# Patient Record
Sex: Male | Born: 1995 | Race: Black or African American | Hispanic: No | Marital: Single | Smoking: Never smoker
Health system: Southern US, Community
[De-identification: ages and names within clinical notes are randomized; demographics above are authoritative.]

## PROBLEM LIST (undated history)

## (undated) DIAGNOSIS — R768 Other specified abnormal immunological findings in serum: Secondary | ICD-10-CM

## (undated) DIAGNOSIS — Z9289 Personal history of other medical treatment: Secondary | ICD-10-CM

## (undated) DIAGNOSIS — J302 Other seasonal allergic rhinitis: Secondary | ICD-10-CM

## (undated) DIAGNOSIS — D649 Anemia, unspecified: Secondary | ICD-10-CM

## (undated) DIAGNOSIS — J45909 Unspecified asthma, uncomplicated: Secondary | ICD-10-CM

## (undated) HISTORY — PX: HERNIA REPAIR: SHX51

---
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
## 2017-01-03 ENCOUNTER — Emergency Department (HOSPITAL_COMMUNITY)
Admission: EM | Admit: 2017-01-03 | Discharge: 2017-01-03 | Disposition: A | Discharge status: Home / Self Care | Payer: Self-pay | Attending: Emergency Medicine | Admitting: Emergency Medicine

## 2017-01-03 ENCOUNTER — Encounter (HOSPITAL_COMMUNITY): Payer: Self-pay | Admitting: *Deleted

## 2017-01-03 ENCOUNTER — Emergency Department (HOSPITAL_COMMUNITY): Payer: Self-pay

## 2017-01-03 DIAGNOSIS — J3089 Other allergic rhinitis: Secondary | ICD-10-CM

## 2017-01-03 DIAGNOSIS — B9789 Other viral agents as the cause of diseases classified elsewhere: Secondary | ICD-10-CM

## 2017-01-03 DIAGNOSIS — R05 Cough: Secondary | ICD-10-CM | POA: Insufficient documentation

## 2017-01-03 DIAGNOSIS — J302 Other seasonal allergic rhinitis: Secondary | ICD-10-CM | POA: Insufficient documentation

## 2017-01-03 DIAGNOSIS — J069 Acute upper respiratory infection, unspecified: Secondary | ICD-10-CM | POA: Insufficient documentation

## 2017-01-03 DIAGNOSIS — B349 Viral infection, unspecified: Secondary | ICD-10-CM | POA: Insufficient documentation

## 2017-01-03 DIAGNOSIS — Z79899 Other long term (current) drug therapy: Secondary | ICD-10-CM | POA: Insufficient documentation

## 2017-01-03 LAB — RAPID STREP SCREEN (MED CTR MEBANE ONLY): STREPTOCOCCUS, GROUP A SCREEN (DIRECT): NEGATIVE

## 2017-01-03 MED ORDER — NAPROXEN 250 MG PO TABS: 250.0000 mg | ORAL_TABLET | Freq: Two times a day (BID) | ORAL | 0 refills | Status: DC

## 2017-01-03 MED ORDER — ACETAMINOPHEN 325 MG PO TABS
650.0000 mg | ORAL_TABLET | Freq: Once | ORAL | Status: AC
  Administered 2017-01-03: 650 mg via ORAL
  Filled 2017-01-03: qty 2

## 2017-01-03 MED ORDER — FLUTICASONE PROPIONATE 50 MCG/ACT NA SUSP: 2.0000 | Freq: Every day | NASAL | 0 refills | Status: DC

## 2017-01-03 MED ORDER — CETIRIZINE HCL 10 MG PO TABS: 10.0000 mg | ORAL_TABLET | Freq: Every day | ORAL | 1 refills | Status: DC

## 2017-01-03 NOTE — ED Provider Notes (Signed)
[facility]   CSN: [id number] Arrival date & time: 01/03/17  1029     History   Chief Complaint Chief Complaint  Patient presents with  . Cough    HPI Joshua Greer is a 21 y.o. male.  Joshua Greer is a 21 y.o. Male who presents to the emergency department complaining of fever, nasal congestion, cough, postnasal drip and runny nose. Patient reports he's been having URI symptoms for about the past week that have worsened over the past 3 days or so. He was seen in urgent care about 2 or 3 days ago was diagnosed with URI. He reports he's now developed a sore throat and has had a fever of 101 three days ago. He's had no treatments prior to arrival today. He is worried he might have pneumonia or bronchitis and would like a chest x-ray. He did not have a chest x-ray at urgent care 3 days ago. He reports symptoms of sneezing, nasal congestion, postnasal drip, sore throat, cough and fevers. He denies any trouble breathing or shortness of breath. He reports his mom is sick at home with bronchitis. He denies shortness of breath, abdominal pain, vomiting, diarrhea, trouble swallowing, or rashes.   The history is provided by the patient and medical records. No language interpreter was used.  Cough  Associated symptoms include chills, ear pain, rhinorrhea and sore throat. Pertinent negatives include no chest pain, no headaches, no myalgias, no shortness of breath and no wheezing.    Past Medical History:  Diagnosis Date  . Anemia   . Asthma   . History of blood transfusion   . Seasonal allergies     There are no active problems to display for this patient.   Past Surgical History:  Procedure Laterality Date  . HERNIA REPAIR         Home Medications    Prior to Admission medications   Medication Sig Start Date End Date Taking? Authorizing Provider  ALBUTEROL IN Inhale into the lungs.    [provider]  benzonatate (TESSALON) 100 MG capsule Take 1  capsule (100 mg total) by mouth every 8 (eight) hours. 06/07/15   Rolland Porter, MD  cetirizine (ZYRTEC ALLERGY) 10 MG tablet Take 1 tablet (10 mg total) by mouth daily. 01/03/17   Everlene Farrier, PA-C  fluticasone (FLONASE) 50 MCG/ACT nasal spray Place 2 sprays into both nostrils daily. 01/03/17   Everlene Farrier, PA-C  naproxen (NAPROSYN) 250 MG tablet Take 1 tablet (250 mg total) by mouth 2 (two) times daily with a meal. 01/03/17   Everlene Farrier, PA-C  ondansetron (ZOFRAN) 4 MG tablet Take 1 tablet (4 mg total) by mouth every 6 (six) hours. 06/07/15   Rolland Porter, MD  predniSONE (DELTASONE) 20 MG tablet Take 2 tablets (40 mg total) by mouth daily with breakfast. For the next four days 06/13/16   Gerhard Munch, MD  promethazine-codeine [facility] WITH CODEINE) 6.25-10 MG/5ML syrup Take 5 mLs by mouth every 6 (six) hours as needed for cough. 06/07/15   Rolland Porter, MD    Family History History reviewed. No pertinent family history.  Social History Social History  Substance Use Topics  . Smoking status: Never Smoker  . Smokeless tobacco: Never Used  . Alcohol use No     Allergies   Patient has no known allergies.   Review of Systems Review of Systems  Constitutional: Positive for chills and fever.  HENT: Positive for congestion, ear pain, postnasal drip, rhinorrhea, sneezing and sore  throat. Negative for ear discharge, facial swelling, hearing loss, nosebleeds, trouble swallowing and voice change.   Eyes: Negative for visual disturbance.  Respiratory: Positive for cough. Negative for shortness of breath and wheezing.   Cardiovascular: Negative for chest pain and palpitations.  Gastrointestinal: Negative for abdominal pain, nausea and vomiting.  Genitourinary: Negative for dysuria.  Musculoskeletal: Negative for back pain, myalgias, neck pain and neck stiffness.  Skin: Negative for rash.  Neurological: Negative for light-headedness and headaches.     Physical Exam Updated Vital  Signs BP 130/64 (BP Location: Right Arm)   Pulse 100   Temp 99.9 F (37.7 C) (Oral)   Resp 18   SpO2 100%   Physical Exam  Constitutional: He appears well-developed and well-nourished. No distress.  Nontoxic appearing.  HENT:  Head: Normocephalic and atraumatic.  Right Ear: External ear normal.  Left Ear: External ear normal.  Mouth/Throat: No oropharyngeal exudate.  Mild clear middle ear effusion noted bilaterally without erythema or loss of landmarks. Rhinorrhea and boggy nasal turbinates bilaterally. Throat is clear. Evidence of some mild postnasal drip. No tonsillar hypertrophy or exudates. No PTA. Uvula is midline without edema. No drooling. No trismus.  Eyes: Pupils are equal, round, and reactive to light. Conjunctivae are normal. Right eye exhibits no discharge. Left eye exhibits no discharge.  Neck: Neck supple. No JVD present.  Cardiovascular: Normal rate, regular rhythm, normal heart sounds and intact distal pulses.   Pulmonary/Chest: Effort normal and breath sounds normal. No stridor. No respiratory distress. He has no wheezes. He has no rales.  Lungs are clear to ascultation bilaterally. Symmetric chest expansion bilaterally. No increased work of breathing. No rales or rhonchi.    Abdominal: Soft. There is no tenderness. There is no guarding.  Musculoskeletal: He exhibits no edema.  Lymphadenopathy:    He has no cervical adenopathy.  Neurological: He is alert. Coordination normal.  Skin: Skin is warm and dry. Capillary refill takes less than 2 seconds. No rash noted. He is not diaphoretic. No erythema. No pallor.  Psychiatric: He has a normal mood and affect. His behavior is normal.  Nursing note and vitals reviewed.    ED Treatments / Results  Labs (all labs ordered are listed, but only abnormal results are displayed) Labs Reviewed  RAPID STREP SCREEN (NOT AT [facility])  CULTURE, GROUP A STREP [facility])    EKG  EKG Interpretation None       Radiology Dg Chest 2  View  Result Date: 01/03/2017 CLINICAL DATA:  Cough and fever.  Symptoms for 3 weeks EXAM: CHEST  2 VIEW COMPARISON:  06/13/2016 FINDINGS: Normal heart size and mediastinal contours. No acute infiltrate or edema. No effusion or pneumothorax. No acute osseous findings. IMPRESSION: Negative chest. Electronically Signed   By: Marnee Spring M.D.   On: 01/03/2017 12:25    Procedures Procedures (including critical care time)  Medications Ordered in ED Medications  acetaminophen (TYLENOL) tablet 650 mg (650 mg Oral Given 01/03/17 1158)     Initial Impression / Assessment and Plan / ED Course  I have reviewed the triage vital signs and the nursing notes.  Pertinent labs & imaging results that were available during my care of the patient were reviewed by me and considered in my medical decision making (see chart for details).     This  is a 21 y.o. Male who presents to the emergency department complaining of fever, nasal congestion, cough, postnasal drip and runny nose. Patient reports he's been having URI symptoms for  about the past week that have worsened over the past 3 days or so. He was seen in urgent care about 2 or 3 days ago was diagnosed with URI. He reports he's now developed a sore throat and has had a fever of 101 three days ago. He's had no treatments prior to arrival today. He is worried he might have pneumonia or bronchitis and would like a chest x-ray. He did not have a chest x-ray at urgent care 3 days ago. He reports symptoms of sneezing, nasal congestion, postnasal drip, sore throat, cough and fevers. He denies any trouble breathing or shortness of breath. He reports his mom is sick at home with bronchitis. He is not a smoker.  On exam the patient is afebrile nontoxic appearing. His lungs are clear to auscultation bilaterally. He has evidence of an upper respiratory infection. Rhinorrhea present. Postnasal drip present. Chest x-ray was obtained which was unremarkable. No evidence of  pneumonia. Rapid strep is also negative. No indication for antibiotics. Suspect as diagnosed previously this is still a viral infection. Will start on Flonase, Zyrtec and naproxen. Advised of his symptoms worsen or persist he needs to return to the ER or follow-up with primary care. I advised the patient to follow-up with their primary care provider this week. I advised the patient to return to the emergency department with new or worsening symptoms or new concerns. The patient verbalized understanding and agreement with plan.     Final Clinical Impressions(s) / ED Diagnoses   Final diagnoses:  Viral URI with cough  Seasonal allergic rhinitis due to other allergic trigger    New Prescriptions New Prescriptions   CETIRIZINE (ZYRTEC ALLERGY) 10 MG TABLET    Take 1 tablet (10 mg total) by mouth daily.   FLUTICASONE (FLONASE) 50 MCG/ACT NASAL SPRAY    Place 2 sprays into both nostrils daily.   NAPROXEN (NAPROSYN) 250 MG TABLET    Take 1 tablet (250 mg total) by mouth 2 (two) times daily with a meal.     Everlene FarrierDansie, Heyward Douthit, PA-C 01/03/17 1241    Lorre NickAllen, Anthony, MD 01/03/17 1415

## 2017-01-03 NOTE — ED Triage Notes (Signed)
Pt reports having nasal congestion and productive cough x 3 weeks. Went to ucc yesterday and was told he has URI. Pt wants to be checked for pneumonia or bronchitis. Airway intact at triage.

## 2017-01-03 NOTE — ED Notes (Signed)
Patient transported to X-ray 

## 2017-01-05 LAB — CULTURE, GROUP A STREP (THRC)

## 2018-01-12 DIAGNOSIS — J309 Allergic rhinitis, unspecified: Secondary | ICD-10-CM | POA: Insufficient documentation

## 2018-03-30 ENCOUNTER — Other Ambulatory Visit: Payer: Self-pay | Admitting: Family Medicine

## 2018-03-30 ENCOUNTER — Ambulatory Visit
Admission: RE | Admit: 2018-03-30 | Discharge: 2018-03-30 | Disposition: A | Discharge status: Home / Self Care | Payer: BLUE CROSS/BLUE SHIELD | Source: Ambulatory Visit | Attending: Family Medicine | Admitting: Family Medicine

## 2018-03-30 DIAGNOSIS — R062 Wheezing: Secondary | ICD-10-CM

## 2018-07-24 ENCOUNTER — Other Ambulatory Visit: Payer: Self-pay

## 2018-07-24 ENCOUNTER — Ambulatory Visit (INDEPENDENT_AMBULATORY_CARE_PROVIDER_SITE_OTHER): Payer: BLUE CROSS/BLUE SHIELD

## 2018-07-24 ENCOUNTER — Encounter (HOSPITAL_COMMUNITY): Payer: Self-pay | Admitting: Emergency Medicine

## 2018-07-24 ENCOUNTER — Ambulatory Visit (HOSPITAL_COMMUNITY)
Admission: EM | Admit: 2018-07-24 | Discharge: 2018-07-24 | Disposition: A | Discharge status: Home / Self Care | Payer: BLUE CROSS/BLUE SHIELD | Attending: Family Medicine | Admitting: Family Medicine

## 2018-07-24 DIAGNOSIS — S99921A Unspecified injury of right foot, initial encounter: Secondary | ICD-10-CM | POA: Diagnosis not present

## 2018-07-24 DIAGNOSIS — S92424A Nondisplaced fracture of distal phalanx of right great toe, initial encounter for closed fracture: Secondary | ICD-10-CM | POA: Diagnosis not present

## 2018-07-24 MED ORDER — IBUPROFEN 800 MG PO TABS: 800.0000 mg | ORAL_TABLET | Freq: Three times a day (TID) | ORAL | 0 refills | Status: DC

## 2018-07-24 NOTE — ED Notes (Signed)
Patient transported to X-ray 

## 2018-07-24 NOTE — ED Provider Notes (Signed)
[facility]    CSN: [id number] Arrival date & time: 07/24/18  9373     History   Chief Complaint Chief Complaint  Patient presents with  . Foot Pain    HPI Joshua Greer is a 23 y.o. male.   HPI  Patient is here for injury to his right foot.  He dropped a steel object across the distal foot and toe last night at work.  His toe is discolored and swollen.  Painful with ambulation.  He states the pain radiates from his toe up to his midfoot.  Pain with movement of toe.  No numbness.  He has reported this to his employer.  Past Medical History:  Diagnosis Date  . Anemia   . Asthma   . History of blood transfusion   . Seasonal allergies     There are no active problems to display for this patient.   Past Surgical History:  Procedure Laterality Date  . HERNIA REPAIR         Home Medications    Prior to Admission medications   Medication Sig Start Date End Date Taking? Authorizing Provider  ALBUTEROL IN Inhale into the lungs.   Yes [provider]  ibuprofen (ADVIL,MOTRIN) 800 MG tablet Take 1 tablet (800 mg total) by mouth 3 (three) times daily. 07/24/18   Eustace Moore, MD    Family History History reviewed. No pertinent family history.  Social History Social History   Tobacco Use  . Smoking status: Never Smoker  . Smokeless tobacco: Never Used  Substance Use Topics  . Alcohol use: No  . Drug use: No     Allergies   Patient has no known allergies.   Review of Systems Review of Systems  Constitutional: Negative for chills and fever.  HENT: Negative for ear pain and sore throat.   Eyes: Negative for pain and visual disturbance.  Respiratory: Negative for cough and shortness of breath.   Cardiovascular: Negative for chest pain and palpitations.  Gastrointestinal: Negative for abdominal pain and vomiting.  Genitourinary: Negative for dysuria and hematuria.  Musculoskeletal: Positive for arthralgias and gait problem. Negative  for back pain.  Skin: Negative for color change and rash.  Neurological: Negative for seizures and syncope.  All other systems reviewed and are negative.    Physical Exam Triage Vital Signs ED Triage Vitals  Enc Vitals Group     BP 07/24/18 0932 121/77     Pulse Rate 07/24/18 0932 (!) 58     Resp 07/24/18 0932 16     Temp 07/24/18 0932 98.4 F (36.9 C)     Temp Source 07/24/18 0932 Oral     SpO2 07/24/18 0932 98 %     Weight --      Height --      Head Circumference --      Peak Flow --      Pain Score 07/24/18 0933 9     Pain Loc --      Pain Edu? --      Excl. in GC? --    No data found.  Updated Vital Signs BP 121/77 (BP Location: Left Arm)   Pulse (!) 58   Temp 98.4 F (36.9 C) (Oral)   Resp 16   SpO2 98%   Visual Acuity Right Eye Distance:   Left Eye Distance:   Bilateral Distance:    Right Eye Near:   Left Eye Near:    Bilateral Near:  Physical Exam Constitutional:      General: He is not in acute distress.    Appearance: He is well-developed.  HENT:     Head: Normocephalic and atraumatic.  Eyes:     Conjunctiva/sclera: Conjunctivae normal.     Pupils: Pupils are equal, round, and reactive to light.  Neck:     Musculoskeletal: Normal range of motion.  Cardiovascular:     Rate and Rhythm: Normal rate and regular rhythm.     Heart sounds: Normal heart sounds.  Pulmonary:     Effort: Pulmonary effort is normal. No respiratory distress.     Breath sounds: Normal breath sounds.  Abdominal:     General: There is no distension.     Palpations: Abdomen is soft.  Musculoskeletal: Normal range of motion.       Feet:  Feet:     Comments: Ecchymosis is present over the IP joint and around the proximal nail without subungual hematoma.  Mild soft tissue swelling Skin:    General: Skin is warm and dry.  Neurological:     Mental Status: He is alert.      UC Treatments / Results  Labs (all labs ordered are listed, but only abnormal results are  displayed) Labs Reviewed - No data to display  EKG None  Radiology Dg Foot Complete Right  Result Date: 07/24/2018 CLINICAL DATA:  Right foot injury. EXAM: RIGHT FOOT COMPLETE - 3+ VIEW COMPARISON:  No recent. FINDINGS: Fracture at the base of the distal phalanx of the right great toe appears to be present. Fracture is nondisplaced. Fracture appears to extend into the interphalangeal joint space. IMPRESSION: Nondisplaced fracture of the base of the distal phalanx of the right great toe. Appears to extend into the interphalangeal joint space. Electronically Signed   By: Maisie Fus  Register   On: 07/24/2018 09:57    Procedures Procedures (including critical care time)  Medications Ordered in UC Medications - No data to display  Initial Impression / Assessment and Plan / UC Course  I have reviewed the triage vital signs and the nursing notes.  Pertinent labs & imaging results that were available during my care of the patient were reviewed by me and considered in my medical decision making (see chart for details).      Final Clinical Impressions(s) / UC Diagnoses   Final diagnoses:  Closed nondisplaced fracture of distal phalanx of right great toe, initial encounter     Discharge Instructions     You will follow-up with your companies Worker's Comp. Provider Limit walking on fractured toe Wear fracture shoe until toe is pain-free Ibuprofen 3 times a day with food as needed for pain    ED Prescriptions    Medication Sig Dispense Auth. Provider   ibuprofen (ADVIL,MOTRIN) 800 MG tablet Take 1 tablet (800 mg total) by mouth 3 (three) times daily. 21 tablet Eustace Moore, MD     Controlled Substance Prescriptions [place] Controlled Substance Registry consulted? Not Applicable   Eustace Moore, MD 07/24/18 1007

## 2018-07-24 NOTE — ED Triage Notes (Signed)
Pt presents to Joshua Greer for assessment of great toe of right foot after he dropped a steel lever on it.  States the toe is swollen, red, and painful to walk on.

## 2018-07-24 NOTE — Discharge Instructions (Addendum)
You will follow-up with your companies Worker's Comp. Provider Limit walking on fractured toe Wear fracture shoe until toe is pain-free Ibuprofen 3 times a day with food as needed for pain

## 2018-08-10 ENCOUNTER — Ambulatory Visit
Admission: EM | Admit: 2018-08-10 | Discharge: 2018-08-10 | Disposition: A | Discharge status: Home / Self Care | Payer: BLUE CROSS/BLUE SHIELD | Attending: Family Medicine | Admitting: Family Medicine

## 2018-08-10 ENCOUNTER — Other Ambulatory Visit: Payer: Self-pay

## 2018-08-10 ENCOUNTER — Encounter: Payer: Self-pay | Admitting: Family Medicine

## 2018-08-10 DIAGNOSIS — J302 Other seasonal allergic rhinitis: Secondary | ICD-10-CM | POA: Diagnosis not present

## 2018-08-10 MED ORDER — PREDNISONE 20 MG PO TABS: ORAL_TABLET | ORAL | 0 refills | Status: DC

## 2018-08-10 MED ORDER — ALBUTEROL SULFATE HFA 108 (90 BASE) MCG/ACT IN AERS: 2.0000 | INHALATION_SPRAY | RESPIRATORY_TRACT | 1 refills | Status: DC | PRN

## 2018-08-10 NOTE — ED Provider Notes (Signed)
[name]    CSN: [id number] Arrival date & time: 08/10/18  1600     History   Chief Complaint Chief Complaint  Patient presents with  . Allergies    HPI Joshua Greer is a 23 y.o. male.   23 yo [facility].  He developed some chest congestion after being exposed to cut grass outside his apartment.  He took Benadryl yesterday and did not work today  He is healing a left toe fracture.  He works at [employer]  He has a h/o allergies in the spring.       Past Medical History:  Diagnosis Date  . Anemia   . Asthma   . History of blood transfusion   . Seasonal allergies     There are no active problems to display for this patient.   Past Surgical History:  Procedure Laterality Date  . HERNIA REPAIR         Home Medications    Prior to Admission medications   Medication Sig Start Date End Date Taking? Authorizing Provider  ALBUTEROL IN Inhale into the lungs.    [provider]  ibuprofen (ADVIL,MOTRIN) 800 MG tablet Take 1 tablet (800 mg total) by mouth 3 (three) times daily. 07/24/18   Eustace MooreNelson, Yvonne Sue, MD  predniSONE (DELTASONE) 20 MG tablet One daily with food 08/10/18   Elvina SidleLauenstein, Venba Zenner, MD    Family History No family history on file.  Social History Social History   Tobacco Use  . Smoking status: Never Smoker  . Smokeless tobacco: Never Used  Substance Use Topics  . Alcohol use: No  . Drug use: No     Allergies   Patient has no known allergies.   Review of Systems Review of Systems  HENT: Positive for congestion.   Respiratory: Positive for chest tightness. Negative for wheezing.   All other systems reviewed and are negative.    Physical Exam Triage Vital Signs ED Triage Vitals  Enc Vitals Group     BP      Pulse      Resp      Temp      Temp src      SpO2      Weight      Height      Head Circumference      Peak Flow      Pain Score      Pain Loc    Pain Edu?      Excl. in [place]?    No data found.  Updated Vital Signs BP 135/72 (BP Location: Left Arm)   Pulse 62   Temp 98.5 F (36.9 C) (Oral)   Resp 18   SpO2 98%    Physical Exam Vitals signs and nursing note reviewed.  Constitutional:      Appearance: Normal appearance. He is normal weight.  HENT:     Head: Normocephalic.     Right Ear: Tympanic membrane, ear canal and external ear normal.     Left Ear: Tympanic membrane, ear canal and external ear normal.     Nose: Congestion present.     Mouth/Throat:     Mouth: Mucous membranes are moist.  Eyes:     Conjunctiva/sclera: Conjunctivae normal.  Neck:     Musculoskeletal: Normal range of motion and neck supple.  Cardiovascular:     Rate and Rhythm: Normal rate and regular rhythm.     Heart sounds:  Normal heart sounds.  Pulmonary:     Effort: Pulmonary effort is normal.     Breath sounds: Normal breath sounds.  Musculoskeletal: Normal range of motion.  Skin:    General: Skin is warm and dry.  Neurological:     General: No focal deficit present.     Mental Status: He is alert and oriented to person, place, and time.  Psychiatric:        Mood and Affect: Mood normal.      UC Treatments / Results  Labs (all labs ordered are listed, but only abnormal results are displayed) Labs Reviewed - No data to display  EKG None  Radiology No results found.  Procedures Procedures (including critical care time)  Medications Ordered in UC Medications - No data to display  Initial Impression / Assessment and Plan / UC Course  I have reviewed the triage vital signs and the nursing notes.  Pertinent labs & imaging results that were available during my care of the patient were reviewed by me and considered in my medical decision making (see chart for details).    Final Clinical Impressions(s) / UC Diagnoses   Final diagnoses:  Seasonal allergies   Discharge Instructions   None    ED Prescriptions     Medication Sig Dispense Auth. Provider   predniSONE (DELTASONE) 20 MG tablet One daily with food 5 tablet Elvina Sidle, MD     Controlled Substance Prescriptions [place] Controlled Substance Registry consulted? Not Applicable   Elvina Sidle, MD 08/10/18 [phone]

## 2018-08-10 NOTE — ED Triage Notes (Signed)
Pt c/o nasal congestion, sneezing and feeling stopped up from pollen x2 days, states needs a work note to return to work.

## 2018-11-25 ENCOUNTER — Encounter: Payer: Self-pay | Admitting: Emergency Medicine

## 2018-11-25 ENCOUNTER — Other Ambulatory Visit: Payer: Self-pay

## 2018-11-25 ENCOUNTER — Ambulatory Visit
Admission: EM | Admit: 2018-11-25 | Discharge: 2018-11-25 | Disposition: A | Discharge status: Home / Self Care | Payer: BC Managed Care – PPO | Attending: Emergency Medicine | Admitting: Emergency Medicine

## 2018-11-25 DIAGNOSIS — J309 Allergic rhinitis, unspecified: Secondary | ICD-10-CM

## 2018-11-25 MED ORDER — ALBUTEROL SULFATE HFA 108 (90 BASE) MCG/ACT IN AERS: 1.0000 | INHALATION_SPRAY | RESPIRATORY_TRACT | 0 refills | Status: DC | PRN

## 2018-11-25 MED ORDER — CETIRIZINE HCL 10 MG PO TABS: 10.0000 mg | ORAL_TABLET | Freq: Every day | ORAL | 0 refills | Status: DC

## 2018-11-25 NOTE — ED Provider Notes (Signed)
[name]    CSN: [id number] Arrival date & time: 11/25/18  1436     History   Chief Complaint Chief Complaint  Patient presents with  . Nasal Congestion    HPI Joshua Greer is a 23 y.o. male.   Joshua Greer presents with complaints of sneezing, nasal drainage, headache, which started three days ago and has been worsening. No fever, no body aches. No cough or sore throat. Mild right ear pain. No rash. No gi complaints. Works for [employer] in [employer]/dust. Has had issues with allergies and bronchitis in the past. Doesn't take any medications for symptoms. Ran out of inhaler. Has tried tylenol with robitussin which have helped some. No known ill contacts.  Hx of asthma, allergies, anemia.    ROS per HPI, negative if not otherwise mentioned.      Past Medical History:  Diagnosis Date  . Anemia   . Asthma   . History of blood transfusion   . Seasonal allergies     There are no active problems to display for this patient.   Past Surgical History:  Procedure Laterality Date  . HERNIA REPAIR         Home Medications    Prior to Admission medications   Medication Sig Start Date End Date Taking? Authorizing Provider  albuterol (VENTOLIN HFA) 108 (90 Base) MCG/ACT inhaler Inhale 1-2 puffs into the lungs every 4 (four) hours as needed for wheezing or shortness of breath (cough, shortness of breath or wheezing.). 11/25/18   Linus MakoBurky, Shakema Surita B, NP  ALBUTEROL IN Inhale into the lungs.    [provider]  cetirizine (ZYRTEC) 10 MG tablet Take 1 tablet (10 mg total) by mouth daily. 11/25/18   Georgetta HaberBurky, Karmella Bouvier B, NP  ibuprofen (ADVIL,MOTRIN) 800 MG tablet Take 1 tablet (800 mg total) by mouth 3 (three) times daily. 07/24/18   Eustace MooreNelson, Yvonne Sue, MD    Family History History reviewed. No pertinent family history.  Social History Social History   Tobacco Use  . Smoking status: Never Smoker  . Smokeless tobacco: Never Used  Substance Use Topics  . Alcohol use:  No  . Drug use: No     Allergies   Patient has no known allergies.   Review of Systems Review of Systems   Physical Exam Triage Vital Signs ED Triage Vitals [11/25/18 1445]  Enc Vitals Group     BP 130/75     Pulse Rate 65     Resp 18     Temp 98.4 F (36.9 C)     Temp Source Oral     SpO2 97 %     Weight      Height      Head Circumference      Peak Flow      Pain Score 0     Pain Loc      Pain Edu?      Excl. in GC?    No data found.  Updated Vital Signs BP 130/75 (BP Location: Left Arm)   Pulse 65   Temp 98.4 F (36.9 C) (Oral)   Resp 18   SpO2 97%    Physical Exam Constitutional:      Appearance: He is well-developed.  Cardiovascular:     Rate and Rhythm: Normal rate.  Pulmonary:     Effort: Pulmonary effort is normal.  Skin:    General: Skin is warm and dry.  Neurological:     Mental Status: He is alert and  oriented to person, place, and time.      UC Treatments / Results  Labs (all labs ordered are listed, but only abnormal results are displayed) Labs Reviewed  NOVEL CORONAVIRUS, NAA    EKG   Radiology No results found.  Procedures Procedures (including critical care time)  Medications Ordered in UC Medications - No data to display  Initial Impression / Assessment and Plan / UC Course  I have reviewed the triage vital signs and the nursing notes.  Pertinent labs & imaging results that were available during my care of the patient were reviewed by me and considered in my medical decision making (see chart for details).     Allergic rhinitis likely, works for [employer] around others, do recommend Covid testing to ensure negative prior to return to work. Return precautions provided. Patient verbalized understanding and agreeable to plan.   Final Clinical Impressions(s) / UC Diagnoses   Final diagnoses:  Allergic rhinitis, unspecified seasonality, unspecified trigger     Discharge Instructions     Daily zyrtec.  Use of  inhaler as needed for wheezing or shortness of breath.   May continue with over the counter tylenol as needed for symptoms.  I do recommend covid testing to ensure safe return to work.  Will notify you of any positive findings.   You may monitor your results on your MyChart online as well.   If symptoms worsen or do not improve in the next week to return to be seen or to follow up with your PCP.      ED Prescriptions    Medication Sig Dispense Auth. Provider   albuterol (VENTOLIN HFA) 108 (90 Base) MCG/ACT inhaler Inhale 1-2 puffs into the lungs every 4 (four) hours as needed for wheezing or shortness of breath (cough, shortness of breath or wheezing.). 8 g Augusto Gamble B, NP   cetirizine (ZYRTEC) 10 MG tablet Take 1 tablet (10 mg total) by mouth daily. 30 tablet Zigmund Gottron, NP     Controlled Substance Prescriptions [place] Controlled Substance Registry consulted? Not Applicable   Zigmund Gottron, NP 11/25/18 1528

## 2018-11-25 NOTE — Discharge Instructions (Addendum)
Daily zyrtec.  Use of inhaler as needed for wheezing or shortness of breath.   May continue with over the counter tylenol as needed for symptoms.  I do recommend covid testing to ensure safe return to work.  Will notify you of any positive findings.   You may monitor your results on your MyChart online as well.   If symptoms worsen or do not improve in the next week to return to be seen or to follow up with your PCP.

## 2018-11-25 NOTE — ED Triage Notes (Signed)
Pt presents to [facility] for assessment of nasal congestion, sneezing, sinus pressure, headaches x 4 days.  Patient denies fevers, body aches, or cough.  C/o some SOB with a hx of asthma and chronic bronchitis.  Patient does not currently have an inhaler.

## 2018-11-25 NOTE — ED Notes (Signed)
Patient able to ambulate independently  

## 2018-11-29 LAB — NOVEL CORONAVIRUS, NAA: SARS-CoV-2, NAA: NOT DETECTED

## 2018-11-30 ENCOUNTER — Encounter (HOSPITAL_COMMUNITY): Payer: Self-pay

## 2019-01-08 ENCOUNTER — Other Ambulatory Visit: Payer: Self-pay

## 2019-01-08 ENCOUNTER — Ambulatory Visit
Admission: EM | Admit: 2019-01-08 | Discharge: 2019-01-08 | Disposition: A | Discharge status: Home / Self Care | Payer: BC Managed Care – PPO | Attending: Physician Assistant | Admitting: Physician Assistant

## 2019-01-08 DIAGNOSIS — R1013 Epigastric pain: Secondary | ICD-10-CM

## 2019-01-08 DIAGNOSIS — R1012 Left upper quadrant pain: Secondary | ICD-10-CM

## 2019-01-08 DIAGNOSIS — R112 Nausea with vomiting, unspecified: Secondary | ICD-10-CM

## 2019-01-08 MED ORDER — OMEPRAZOLE 20 MG PO CPDR: 20.0000 mg | DELAYED_RELEASE_CAPSULE | Freq: Every day | ORAL | 0 refills | Status: DC

## 2019-01-08 NOTE — ED Provider Notes (Signed)
[name]    CSN: [id number] Arrival date & time: 01/08/19  1607      History   Chief Complaint Chief Complaint  Patient presents with  . Emesis    HPI Joshua Greer is a 23 y.o. male.   23 yo male with a reports history of acid reflux presents with abdominal pain, nausea, and vomiting x1 day. He reports eating a burger and fries at a new restaurant today and having N/V about 1 hour after. He has had 2 nonbilious nonbloody vomited. He is able to keep down fluids. He reports epigastric and LUQ pain, describes pain as an ache and some pressure. He has been belching frequently. He has tried pepto bismol and peppermint candy with good relief. Denies constipation, diarrhea. Denies fever. History of umbilical hernia repair, no other abdominal surgeries.      Past Medical History:  Diagnosis Date  . Anemia   . Asthma   . History of blood transfusion   . Seasonal allergies     There are no active problems to display for this patient.   Past Surgical History:  Procedure Laterality Date  . HERNIA REPAIR         Home Medications    Prior to Admission medications   Medication Sig Start Date End Date Taking? Authorizing Provider  albuterol (VENTOLIN HFA) 108 (90 Base) MCG/ACT inhaler Inhale 1-2 puffs into the lungs every 4 (four) hours as needed for wheezing or shortness of breath (cough, shortness of breath or wheezing.). 11/25/18   Augusto Gamble B, NP  ALBUTEROL IN Inhale into the lungs.    [provider]  cetirizine (ZYRTEC) 10 MG tablet Take 1 tablet (10 mg total) by mouth daily. 11/25/18   Zigmund Gottron, NP  ibuprofen (ADVIL,MOTRIN) 800 MG tablet Take 1 tablet (800 mg total) by mouth 3 (three) times daily. 07/24/18   Raylene Everts, MD  omeprazole (PRILOSEC) 20 MG capsule Take 1 capsule (20 mg total) by mouth daily for 7 days. 01/08/19 01/15/19  Ok Edwards, PA-C    Family History Family History  Problem Relation Age of Onset  . Healthy Mother    . Healthy Father     Social History Social History   Tobacco Use  . Smoking status: Never Smoker  . Smokeless tobacco: Never Used  Substance Use Topics  . Alcohol use: No  . Drug use: No     Allergies   Patient has no known allergies.   Review of Systems Review of Systems  See HPI.    Physical Exam Triage Vital Signs ED Triage Vitals  Enc Vitals Group     BP 01/08/19 1624 119/70     Pulse Rate 01/08/19 1624 (!) 56     Resp 01/08/19 1624 16     Temp 01/08/19 1624 98.4 F (36.9 C)     Temp Source 01/08/19 1624 Oral     SpO2 01/08/19 1624 97 %     Weight --      Height --      Head Circumference --      Peak Flow --      Pain Score 01/08/19 1627 5     Pain Loc --      Pain Edu? --      Excl. in [place]? --    No data found.  Updated Vital Signs BP 119/70 (BP Location: Left Arm)   Pulse (!) 56   Temp 98.4 F (36.9 C) (Oral)  Resp 16   SpO2 97%   Physical Exam Constitutional:      General: He is not in acute distress.    Appearance: Normal appearance. He is not ill-appearing, toxic-appearing or diaphoretic.  HENT:     Head: Normocephalic and atraumatic.  Cardiovascular:     Rate and Rhythm: Normal rate and regular rhythm.     Heart sounds: Normal heart sounds.  Pulmonary:     Effort: Pulmonary effort is normal.     Breath sounds: Normal breath sounds.  Abdominal:     General: Abdomen is flat. A surgical scar is present. Bowel sounds are normal. There is no distension.     Palpations: Abdomen is soft. There is no mass.     Tenderness: There is abdominal tenderness in the epigastric area and left upper quadrant. There is no guarding or rebound. Negative signs include Murphy's sign.  Neurological:     Mental Status: He is alert.      UC Treatments / Results  Labs (all labs ordered are listed, but only abnormal results are displayed) Labs Reviewed - No data to display  EKG   Radiology No results found.  Procedures Procedures (including  critical care time)  Medications Ordered in UC Medications - No data to display  Initial Impression / Assessment and Plan / UC Course  I have reviewed the triage vital signs and the nursing notes.  Pertinent labs & imaging results that were available during my care of the patient were reviewed by me and considered in my medical decision making (see chart for details).     No alarming signs on exam. Lower suspicion for cholelithiasis, peptic ulcer, pancreatitis at this time. Discussed possible gastritis, GERD causing symptoms. Symptomatic treatment discussed. Push fluids. Return precautions given.  Final Clinical Impressions(s) / UC Diagnoses   Final diagnoses:  Intractable vomiting with nausea, unspecified vomiting type  Abdominal pain, epigastric  Left upper quadrant pain   ED Prescriptions    Medication Sig Dispense Auth. Provider   omeprazole (PRILOSEC) 20 MG capsule Take 1 capsule (20 mg total) by mouth daily for 7 days. 7 capsule Threasa AlphaYu, Ameliana Brashear V, PA-C        Jeanean Hollett V, [place]PA-C 01/08/19 1800

## 2019-01-08 NOTE — ED Triage Notes (Signed)
Pt presents to UC w/ abdominal pain with nausea and vomiting starting today. Pt reports taking peptobismol today. Pt reports symptoms started after eating at a new restaurant.

## 2019-01-08 NOTE — Discharge Instructions (Signed)
Start omeprazole daily until feeling better. If acid reflux continues, try over the counter pepcid daily. Drink plenty of water, bland diet until feeling better. Can continue pepto bismol. Follow up with PCP if symptoms continue. Go to ED if vomiting blood, fever, unable to keep down water, sudden worsening of stomach pain or unable to jump up and down because of pain.

## 2019-09-17 ENCOUNTER — Ambulatory Visit
Admission: EM | Admit: 2019-09-17 | Discharge: 2019-09-17 | Disposition: A | Discharge status: Home / Self Care | Payer: BC Managed Care – PPO | Attending: Physician Assistant | Admitting: Physician Assistant

## 2019-09-17 ENCOUNTER — Encounter: Payer: Self-pay | Admitting: Emergency Medicine

## 2019-09-17 ENCOUNTER — Other Ambulatory Visit: Payer: Self-pay

## 2019-09-17 DIAGNOSIS — J3489 Other specified disorders of nose and nasal sinuses: Secondary | ICD-10-CM

## 2019-09-17 DIAGNOSIS — R0981 Nasal congestion: Secondary | ICD-10-CM | POA: Diagnosis not present

## 2019-09-17 DIAGNOSIS — R509 Fever, unspecified: Secondary | ICD-10-CM

## 2019-09-17 MED ORDER — IPRATROPIUM BROMIDE 0.06 % NA SOLN: 2.0000 | Freq: Four times a day (QID) | NASAL | 0 refills | Status: DC

## 2019-09-17 MED ORDER — FLUTICASONE PROPIONATE 50 MCG/ACT NA SUSP: 2.0000 | Freq: Every day | NASAL | 0 refills | Status: DC

## 2019-09-17 NOTE — ED Triage Notes (Signed)
Pt sts nasal congestion and sinus pressure x 4 days with fever Wednesday night of 100 that has now resolved; denies cough

## 2019-09-17 NOTE — Discharge Instructions (Signed)
COVID PCR testing ordered. I would like you to quarantine until testing results. Start flonase, atrovent nasal spray for nasal congestion/drainage. You can use over the counter nasal saline rinse such as neti pot for nasal congestion. Keep hydrated, your urine should be clear to pale yellow in color. Tylenol/motrin for fever and pain. Monitor for any worsening of symptoms, chest pain, shortness of breath, wheezing, swelling of the throat, go to the emergency department for further evaluation needed.   

## 2019-09-17 NOTE — ED Provider Notes (Signed)
[name]    CSN: [id number] Arrival date & time: 09/17/19  1007      History   Chief Complaint Chief Complaint  Patient presents with  . Nasal Congestion    HPI Joshua Greer is a 24 y.o. male.   24 year old male comes in for 4 day of URI symptoms. Nasal congestion, sinus pressure. Denies cough. tmax 100. Denies chills, body aches. States coughing/vomiting up mucus due to post nasal drip.. Denies abdominal pain, nausea, diarrhea. Denies shortness of breath, loss of taste/smell. Claritin without relief.      Past Medical History:  Diagnosis Date  . Anemia   . Asthma   . History of blood transfusion   . Seasonal allergies     There are no problems to display for this patient.   Past Surgical History:  Procedure Laterality Date  . HERNIA REPAIR         Home Medications    Prior to Admission medications   Medication Sig Start Date End Date Taking? Authorizing Provider  albuterol (VENTOLIN HFA) 108 (90 Base) MCG/ACT inhaler Inhale 1-2 puffs into the lungs every 4 (four) hours as needed for wheezing or shortness of breath (cough, shortness of breath or wheezing.). 11/25/18   Augusto Gamble B, NP  ALBUTEROL IN Inhale into the lungs.    [provider]  cetirizine (ZYRTEC) 10 MG tablet Take 1 tablet (10 mg total) by mouth daily. 11/25/18   Zigmund Gottron, NP  fluticasone (FLONASE) 50 MCG/ACT nasal spray Place 2 sprays into both nostrils daily. 09/17/19   Tasia Catchings, Amy V, PA-C  ibuprofen (ADVIL,MOTRIN) 800 MG tablet Take 1 tablet (800 mg total) by mouth 3 (three) times daily. 07/24/18   Raylene Everts, MD  ipratropium (ATROVENT) 0.06 % nasal spray Place 2 sprays into both nostrils 4 (four) times daily. 09/17/19   Tasia Catchings, Amy V, PA-C  omeprazole (PRILOSEC) 20 MG capsule Take 1 capsule (20 mg total) by mouth daily for 7 days. 01/08/19 01/15/19  Ok Edwards, PA-C    Family History Family History  Problem Relation Age of Onset  . Healthy Mother   . Healthy  Father     Social History Social History   Tobacco Use  . Smoking status: Never Smoker  . Smokeless tobacco: Never Used  Substance Use Topics  . Alcohol use: No  . Drug use: No     Allergies   Patient has no known allergies.   Review of Systems Review of Systems  Reason unable to perform ROS: See HPI as above.     Physical Exam Triage Vital Signs ED Triage Vitals  Enc Vitals Group     BP 09/17/19 1018 (!) 121/56     Pulse Rate 09/17/19 1018 (!) 50     Resp 09/17/19 1018 18     Temp 09/17/19 1018 98.4 F (36.9 C)     Temp Source 09/17/19 1018 Oral     SpO2 09/17/19 1018 97 %     Weight --      Height --      Head Circumference --      Peak Flow --      Pain Score 09/17/19 1019 4     Pain Loc --      Pain Edu? --      Excl. in [place]? --    No data found.  Updated Vital Signs BP (!) 121/56 (BP Location: Right Arm)   Pulse (!) 50  Temp 98.4 F (36.9 C) (Oral)   Resp 18   SpO2 97%   Physical Exam Constitutional:      General: He is not in acute distress.    Appearance: Normal appearance. He is not ill-appearing, toxic-appearing or diaphoretic.  HENT:     Head: Normocephalic and atraumatic.     Right Ear: Tympanic membrane, ear canal and external ear normal.     Left Ear: Tympanic membrane, ear canal and external ear normal.     Mouth/Throat:     Mouth: Mucous membranes are moist.     Pharynx: Oropharynx is clear. Uvula midline.  Cardiovascular:     Rate and Rhythm: Normal rate and regular rhythm.     Heart sounds: Normal heart sounds. No murmur. No friction rub. No gallop.   Pulmonary:     Effort: Pulmonary effort is normal. No accessory muscle usage, prolonged expiration, respiratory distress or retractions.     Comments: Lungs clear to auscultation without adventitious lung sounds. Musculoskeletal:     Cervical back: Normal range of motion and neck supple.  Neurological:     General: No focal deficit present.     Mental Status: He is alert and  oriented to person, place, and time.    UC Treatments / Results  Labs (all labs ordered are listed, but only abnormal results are displayed) Labs Reviewed  NOVEL CORONAVIRUS, NAA    EKG   Radiology No results found.  Procedures Procedures (including critical care time)  Medications Ordered in UC Medications - No data to display  Initial Impression / Assessment and Plan / UC Course  I have reviewed the triage vital signs and the nursing notes.  Pertinent labs & imaging results that were available during my care of the patient were reviewed by me and considered in my medical decision making (see chart for details).    Diagnoses of fever was put in by RN prior to me seeing patient. Patient with Tmax of 100, denies temp >100.4. LCTAB. COVID PCR test ordered. Patient to quarantine until testing results return. Symptomatic treatment discussed.  Push fluids.  Return precautions given.  Patient expresses understanding and agrees to plan.  Final Clinical Impressions(s) / UC Diagnoses   Final diagnoses:  Fever, unspecified  Rhinorrhea  Nasal congestion    ED Prescriptions    Medication Sig Dispense Auth. Provider   fluticasone (FLONASE) 50 MCG/ACT nasal spray Place 2 sprays into both nostrils daily. 1 g Yu, Amy V, PA-C   ipratropium (ATROVENT) 0.06 % nasal spray Place 2 sprays into both nostrils 4 (four) times daily. 15 mL Belinda Fisher, PA-C     PDMP not reviewed this encounter.   Belinda Fisher, PA-C 09/17/19 1042

## 2019-09-18 LAB — NOVEL CORONAVIRUS, NAA: SARS-CoV-2, NAA: NOT DETECTED

## 2019-09-18 LAB — SARS-COV-2, NAA 2 DAY TAT

## 2019-10-26 ENCOUNTER — Ambulatory Visit
Admission: EM | Admit: 2019-10-26 | Discharge: 2019-10-26 | Disposition: A | Discharge status: Home / Self Care | Payer: BC Managed Care – PPO | Attending: Physician Assistant | Admitting: Physician Assistant

## 2019-10-26 ENCOUNTER — Other Ambulatory Visit: Payer: Self-pay

## 2019-10-26 ENCOUNTER — Encounter: Payer: Self-pay | Admitting: Emergency Medicine

## 2019-10-26 DIAGNOSIS — R05 Cough: Secondary | ICD-10-CM | POA: Diagnosis not present

## 2019-10-26 DIAGNOSIS — Z20828 Contact with and (suspected) exposure to other viral communicable diseases: Secondary | ICD-10-CM | POA: Diagnosis not present

## 2019-10-26 DIAGNOSIS — R0982 Postnasal drip: Secondary | ICD-10-CM

## 2019-10-26 DIAGNOSIS — J3489 Other specified disorders of nose and nasal sinuses: Secondary | ICD-10-CM

## 2019-10-26 DIAGNOSIS — R059 Cough, unspecified: Secondary | ICD-10-CM

## 2019-10-26 MED ORDER — PREDNISONE 50 MG PO TABS: 50.0000 mg | ORAL_TABLET | Freq: Every day | ORAL | 0 refills | Status: DC

## 2019-10-26 NOTE — Discharge Instructions (Signed)
COVID PCR testing ordered. I would like you to quarantine until testing results. Restart flonase and atrovent nasal spray. Start prednisone if needed for sinus pressure. Tylenol/motrin for pain and fever. Keep hydrated, urine should be clear to pale yellow in color. If experiencing shortness of breath, trouble breathing, go to the emergency department for further evaluation needed.

## 2019-10-26 NOTE — ED Provider Notes (Signed)
[name]    CSN: [id number] Arrival date & time: 10/26/19  1004      History   Chief Complaint Chief Complaint  Patient presents with  . Cough    HPI Joshua Greer is a 24 y.o. male.   24 year old male comes in for 4 day of URI symptoms. Has had cough, rhinorrhea, post nasal drainage. Denies fever, chills, body aches. Had vomited mucous twice.  Denies abdominal pain, nausea, diarrhea. Denies shortness of breath, loss of taste/smell. Felt wheezing without improvement using albuterol. Using nasal spray without much relief.      Past Medical History:  Diagnosis Date  . Anemia   . Asthma   . History of blood transfusion   . Seasonal allergies     There are no problems to display for this patient.   Past Surgical History:  Procedure Laterality Date  . HERNIA REPAIR       Home Medications    Prior to Admission medications   Medication Sig Start Date End Date Taking? Authorizing Provider  albuterol (VENTOLIN HFA) 108 (90 Base) MCG/ACT inhaler Inhale 1-2 puffs into the lungs every 4 (four) hours as needed for wheezing or shortness of breath (cough, shortness of breath or wheezing.). 11/25/18  Yes Burky, Dorene Grebe B, NP  cetirizine (ZYRTEC) 10 MG tablet Take 1 tablet (10 mg total) by mouth daily. 11/25/18  Yes Burky, Dorene Grebe B, NP  fluticasone (FLONASE) 50 MCG/ACT nasal spray Place 2 sprays into both nostrils daily. 09/17/19  Yes Temple Ewart V, PA-C  ibuprofen (ADVIL,MOTRIN) 800 MG tablet Take 1 tablet (800 mg total) by mouth 3 (three) times daily. 07/24/18   Eustace Moore, MD  ipratropium (ATROVENT) 0.06 % nasal spray Place 2 sprays into both nostrils 4 (four) times daily. 09/17/19   Cathie Hoops, Decklin Weddington V, PA-C  omeprazole (PRILOSEC) 20 MG capsule Take 1 capsule (20 mg total) by mouth daily for 7 days. 01/08/19 01/15/19  Belinda Fisher, PA-C  predniSONE (DELTASONE) 50 MG tablet Take 1 tablet (50 mg total) by mouth daily with breakfast. 10/26/19   Cathie Hoops, Torion Hulgan V, PA-C  ALBUTEROL IN Inhale  into the lungs.  10/26/19  [provider]    Family History Family History  Problem Relation Age of Onset  . Healthy Mother   . Healthy Father     Social History Social History   Tobacco Use  . Smoking status: Never Smoker  . Smokeless tobacco: Never Used  Substance Use Topics  . Alcohol use: No  . Drug use: No     Allergies   Patient has no known allergies.   Review of Systems Review of Systems  Reason unable to perform ROS: See HPI as above.     Physical Exam Triage Vital Signs ED Triage Vitals  Enc Vitals Group     BP 10/26/19 1122 129/68     Pulse Rate 10/26/19 1122 (!) 52     Resp 10/26/19 1122 16     Temp 10/26/19 1122 98.1 F (36.7 C)     Temp Source 10/26/19 1122 Oral     SpO2 10/26/19 1122 97 %     Weight 10/26/19 1158 196 lb 6.4 oz (89.1 kg)     Height --      Head Circumference --      Peak Flow --      Pain Score 10/26/19 1133 0     Pain Loc --      Pain Edu? --  Excl. in GC? --    No data found.  Updated Vital Signs BP 129/68 (BP Location: Right Arm)   Pulse (!) 52   Temp 98.1 F (36.7 C) (Oral)   Resp 16   Wt 196 lb 6.4 oz (89.1 kg)   SpO2 97%   BMI 32.68 kg/m   Physical Exam Constitutional:      General: He is not in acute distress.    Appearance: Normal appearance. He is not ill-appearing, toxic-appearing or diaphoretic.  HENT:     Head: Normocephalic and atraumatic.     Right Ear: Ear canal and external ear normal. Tympanic membrane is erythematous. Tympanic membrane is not bulging.     Left Ear: Ear canal and external ear normal. Tympanic membrane is erythematous. Tympanic membrane is not bulging.     Mouth/Throat:     Mouth: Mucous membranes are moist.     Pharynx: Oropharynx is clear. Uvula midline.  Cardiovascular:     Rate and Rhythm: Normal rate and regular rhythm.     Heart sounds: Normal heart sounds. No murmur heard.  No friction rub. No gallop.   Pulmonary:     Effort: Pulmonary effort is normal.  No accessory muscle usage, prolonged expiration, respiratory distress or retractions.     Comments: Lungs clear to auscultation without adventitious lung sounds. Musculoskeletal:     Cervical back: Normal range of motion and neck supple.  Neurological:     General: No focal deficit present.     Mental Status: He is alert and oriented to person, place, and time.      UC Treatments / Results  Labs (all labs ordered are listed, but only abnormal results are displayed) Labs Reviewed  NOVEL CORONAVIRUS, NAA    EKG   Radiology No results found.  Procedures Procedures (including critical care time)  Medications Ordered in UC Medications - No data to display  Initial Impression / Assessment and Plan / UC Course  I have reviewed the triage vital signs and the nursing notes.  Pertinent labs & imaging results that were available during my care of the patient were reviewed by me and considered in my medical decision making (see chart for details).    COVID PCR test ordered. Patient to quarantine until testing results return. No alarming signs on exam.  Patient speaking in full sentences without respiratory distress.  Symptomatic treatment discussed.  Push fluids.  Return precautions given.  Patient expresses understanding and agrees to plan.  Final Clinical Impressions(s) / UC Diagnoses   Final diagnoses:  Cough  Rhinorrhea  Post-nasal drip   ED Prescriptions    Medication Sig Dispense Auth. Provider   predniSONE (DELTASONE) 50 MG tablet Take 1 tablet (50 mg total) by mouth daily with breakfast. 5 tablet Ok Edwards, PA-C     PDMP not reviewed this encounter.   Ok Edwards, PA-C 10/26/19 1217

## 2019-10-26 NOTE — ED Notes (Signed)
Obtained covid swab, labeled , verified name and dob.

## 2019-10-26 NOTE — ED Triage Notes (Signed)
Patient reports cough, runny nose, head congestion.  Patient reports he has been wheezing.  Patient reports this episode has been present since last Friday 6/18.  Patient reports thick, green phlegm.   Denies fever.

## 2019-10-27 LAB — SARS-COV-2, NAA 2 DAY TAT

## 2019-10-27 LAB — NOVEL CORONAVIRUS, NAA: SARS-CoV-2, NAA: NOT DETECTED

## 2019-10-28 ENCOUNTER — Ambulatory Visit
Admission: EM | Admit: 2019-10-28 | Discharge: 2019-10-28 | Discharge status: Absent without leave | Payer: BC Managed Care – PPO

## 2019-12-27 ENCOUNTER — Ambulatory Visit
Admission: EM | Admit: 2019-12-27 | Discharge: 2019-12-27 | Disposition: A | Discharge status: Home / Self Care | Payer: BC Managed Care – PPO | Attending: Family Medicine | Admitting: Family Medicine

## 2019-12-27 ENCOUNTER — Other Ambulatory Visit: Payer: Self-pay

## 2019-12-27 DIAGNOSIS — R0981 Nasal congestion: Secondary | ICD-10-CM

## 2019-12-27 MED ORDER — FLUTICASONE PROPIONATE 50 MCG/ACT NA SUSP: 2.0000 | Freq: Every day | NASAL | 0 refills | Status: DC

## 2019-12-27 MED ORDER — CETIRIZINE HCL 10 MG PO TABS: 10.0000 mg | ORAL_TABLET | Freq: Every day | ORAL | 0 refills | Status: DC

## 2019-12-27 NOTE — ED Triage Notes (Addendum)
Pt presents with complaints of nausea and nasal congestion x 3 days. Requesting covid testing. Denies any other symptoms. Pt has been exposed to covid in the last couple weeks at work. Pt also reports pressure in his eyes.

## 2019-12-27 NOTE — Discharge Instructions (Signed)
Zyrtec and Flonase for symptoms.  Your Covid swab is pending.  We will call you with any positive results.

## 2019-12-28 LAB — NOVEL CORONAVIRUS, NAA: SARS-CoV-2, NAA: NOT DETECTED

## 2019-12-28 LAB — SARS-COV-2, NAA 2 DAY TAT

## 2019-12-28 NOTE — ED Provider Notes (Signed)
[facility]    CSN: [id number] Arrival date & time: 12/27/19  1322      History   Chief Complaint Chief Complaint  Patient presents with  . Nasal Congestion    HPI Joshua Greer is a 24 y.o. male.   Patient is a 24 year old male the presents today with nasal congestion, nausea x3 days.  Requesting Covid testing.  Denies any other associated symptoms.  Has been exposed to Covid last couple months.  HE reports pressure behind his eyes.     Past Medical History:  Diagnosis Date  . Anemia   . Asthma   . History of blood transfusion   . Seasonal allergies     Patient Active Problem List   Diagnosis Date Noted  . Nasal congestion 12/27/2019    Past Surgical History:  Procedure Laterality Date  . HERNIA REPAIR         Home Medications    Prior to Admission medications   Medication Sig Start Date End Date Taking? Authorizing Provider  albuterol (VENTOLIN HFA) 108 (90 Base) MCG/ACT inhaler Inhale 1-2 puffs into the lungs every 4 (four) hours as needed for wheezing or shortness of breath (cough, shortness of breath or wheezing.). 11/25/18   Georgetta Haber, NP  cetirizine (ZYRTEC) 10 MG tablet Take 1 tablet (10 mg total) by mouth daily. 12/27/19   Dahlia Byes A, NP  fluticasone (FLONASE) 50 MCG/ACT nasal spray Place 2 sprays into both nostrils daily. 12/27/19   Dahlia Byes A, NP  ibuprofen (ADVIL,MOTRIN) 800 MG tablet Take 1 tablet (800 mg total) by mouth 3 (three) times daily. 07/24/18   Eustace Moore, MD  ipratropium (ATROVENT) 0.06 % nasal spray Place 2 sprays into both nostrils 4 (four) times daily. 09/17/19   Cathie Hoops, Amy V, PA-C  omeprazole (PRILOSEC) 20 MG capsule Take 1 capsule (20 mg total) by mouth daily for 7 days. 01/08/19 01/15/19  Belinda Fisher, PA-C  predniSONE (DELTASONE) 50 MG tablet Take 1 tablet (50 mg total) by mouth daily with breakfast. 10/26/19   Cathie Hoops, Amy V, PA-C  ALBUTEROL IN Inhale into the lungs.  10/26/19  [provider]    Family  History Family History  Problem Relation Age of Onset  . Healthy Mother   . Healthy Father     Social History Social History   Tobacco Use  . Smoking status: Never Smoker  . Smokeless tobacco: Never Used  Substance Use Topics  . Alcohol use: No  . Drug use: No     Allergies   Patient has no known allergies.   Review of Systems Review of Systems   Physical Exam Triage Vital Signs ED Triage Vitals  Enc Vitals Group     BP 12/27/19 1355 114/68     Pulse Rate 12/27/19 1355 (!) 52     Resp 12/27/19 1355 18     Temp 12/27/19 1355 98.7 F (37.1 C)     Temp src --      SpO2 12/27/19 1355 97 %     Weight --      Height --      Head Circumference --      Peak Flow --      Pain Score 12/27/19 1353 0     Pain Loc --      Pain Edu? --      Excl. in GC? --    No data found.  Updated Vital Signs BP 114/68   Pulse (!) 52  Temp 98.7 F (37.1 C)   Resp 18   SpO2 97%   Visual Acuity Right Eye Distance:   Left Eye Distance:   Bilateral Distance:    Right Eye Near:   Left Eye Near:    Bilateral Near:     Physical Exam Vitals and nursing note reviewed.  Constitutional:      Appearance: Normal appearance.  HENT:     Head: Normocephalic and atraumatic.     Right Ear: Tympanic membrane and ear canal normal.     Left Ear: Tympanic membrane and ear canal normal.     Nose: Nose normal.     Mouth/Throat:     Pharynx: Oropharynx is clear.  Eyes:     Conjunctiva/sclera: Conjunctivae normal.  Pulmonary:     Effort: Pulmonary effort is normal.  Musculoskeletal:        General: Normal range of motion.     Cervical back: Normal range of motion.  Skin:    General: Skin is warm and dry.  Neurological:     Mental Status: He is alert.  Psychiatric:        Mood and Affect: Mood normal.      UC Treatments / Results  Labs (all labs ordered are listed, but only abnormal results are displayed) Labs Reviewed  NOVEL CORONAVIRUS, NAA    EKG   Radiology No  results found.  Procedures Procedures (including critical care time)  Medications Ordered in UC Medications - No data to display  Initial Impression / Assessment and Plan / UC Course  I have reviewed the triage vital signs and the nursing notes.  Pertinent labs & imaging results that were available during my care of the patient were reviewed by me and considered in my medical decision making (see chart for details).     Nasal congestion Covid swab pending Recommended Flonase and Zyrtec for symptoms. Follow up as needed for continued or worsening symptoms  Final Clinical Impressions(s) / UC Diagnoses   Final diagnoses:  Nasal congestion     Discharge Instructions     Zyrtec and Flonase for symptoms.  Your Covid swab is pending.  We will call you with any positive results.    ED Prescriptions    Medication Sig Dispense Auth. Provider   fluticasone (FLONASE) 50 MCG/ACT nasal spray Place 2 sprays into both nostrils daily. 1 g Myishia Kasik A, NP   cetirizine (ZYRTEC) 10 MG tablet Take 1 tablet (10 mg total) by mouth daily. 30 tablet Dahlia Byes A, NP     PDMP not reviewed this encounter.   Dahlia Byes A, NP 12/28/19 1023

## 2020-05-04 ENCOUNTER — Other Ambulatory Visit: Payer: BC Managed Care – PPO

## 2020-05-10 ENCOUNTER — Telehealth: Payer: Medicaid Other | Admitting: Nurse Practitioner

## 2020-05-10 DIAGNOSIS — U071 COVID-19: Secondary | ICD-10-CM

## 2020-05-10 MED ORDER — ALBUTEROL SULFATE HFA 108 (90 BASE) MCG/ACT IN AERS: 1.0000 | INHALATION_SPRAY | RESPIRATORY_TRACT | 0 refills | Status: DC | PRN

## 2020-05-10 NOTE — Progress Notes (Signed)
E-Visit for Corona Virus Screening  We are sorry you are not feeling well. We are here to help!  You have tested positive for COVID-19, meaning that you were infected with the novel coronavirus and could give the virus to others.  It is vitally important that you stay home so you do not spread it to others.      Please continue isolation at home, for at least 10 days since the start of your symptoms and until you have had 24 hours with no fever (without taking a fever reducer) and with improving of symptoms.  If you have no symptoms but tested positive (or all symptoms resolve after 5 days and you have no fever) you can leave your house but continue to wear a mask around others for an additional 5 days. If you have a fever,continue to stay home until you have had 24 hours of no fever. Most cases improve 5-10 days from onset but we have seen a small number of patients who have gotten worse after the 10 days.  Please be sure to watch for worsening symptoms and remain taking the proper precautions.   Go to the nearest hospital ED for assessment if fever/cough/breathlessness are severe or illness seems like a threat to life.    The following symptoms may appear 2-14 days after exposure: . Fever . Cough . Shortness of breath or difficulty breathing . Chills . Repeated shaking with chills . Muscle pain . Headache . Sore throat . New loss of taste or smell . Fatigue . Congestion or runny nose . Nausea or vomiting . Diarrhea  You have been enrolled in [facility] Monitoring for COVID-19. Daily you will receive a questionnaire within the MyChart website. Our COVID-19 response team will be monitoring your responses daily.  You can use medication such as A prescription inhaler called Albuterol MDI 90 mcg /actuation 2 puffs every 4 hours as needed for shortness of breath, wheezing, cough  You have tested positive for Covid but because you are not considered high risk you do not qualify for  monoclonal antibody infusion.  Supportive care is all that is needed.   You may also take acetaminophen (Tylenol) as needed for fever.  HOME CARE: . Only take medications as instructed by your medical team. . Drink plenty of fluids and get plenty of rest. . A steam or ultrasonic humidifier can help if you have congestion.   GET HELP RIGHT AWAY IF YOU HAVE EMERGENCY WARNING SIGNS.  Call 911 or proceed to your closest emergency facility if: . You develop worsening high fever. . Trouble breathing . Bluish lips or face . Persistent pain or pressure in the chest . New confusion . Inability to wake or stay awake . You cough up blood. . Your symptoms become more severe . Inability to hold down food or fluids  This list is not all possible symptoms. Contact your medical provider for any symptoms that are severe or concerning to you.    Your e-visit answers were reviewed by a board certified advanced clinical practitioner to complete your personal care plan.  Depending on the condition, your plan could have included both over the counter or prescription medications.  If there is a problem please reply once you have received a response from your provider.  Your safety is important to [place].  If you have drug allergies check your prescription carefully.    You can use MyChart to ask questions about today's visit, request a non-urgent call back,  or ask for a work or school excuse for 24 hours related to this e-Visit. If it has been greater than 24 hours you will need to follow up with your provider, or enter a new e-Visit to address those concerns. You will get an e-mail in the next two days asking about your experience.  I hope that your e-visit has been valuable and will speed your recovery. Thank you for using e-visits.   5-10 minutes spent reviewing and documenting in chart.

## 2020-08-03 ENCOUNTER — Other Ambulatory Visit: Payer: Self-pay

## 2020-08-03 ENCOUNTER — Ambulatory Visit
Admission: EM | Admit: 2020-08-03 | Discharge: 2020-08-03 | Disposition: A | Discharge status: Home / Self Care | Payer: Medicaid Other | Attending: Family Medicine | Admitting: Family Medicine

## 2020-08-03 DIAGNOSIS — R112 Nausea with vomiting, unspecified: Secondary | ICD-10-CM

## 2020-08-03 MED ORDER — ONDANSETRON 4 MG PO TBDP: 4.0000 mg | ORAL_TABLET | Freq: Three times a day (TID) | ORAL | 0 refills | Status: DC | PRN

## 2020-08-03 NOTE — ED Triage Notes (Signed)
Pt c/o vomiting x1 last night and twice this morning. Pt c/o dry throat. States hx of GERD with same sx's.

## 2020-08-03 NOTE — ED Provider Notes (Addendum)
  [facility]   [id number] 08/03/20 Arrival Time: 0802  ASSESSMENT & PLAN:  1. Intractable vomiting with nausea, unspecified vomiting type    Benign abdomen. No s/s of appendicitis. Discussed. Work note provided.  Meds ordered this encounter  Medications  . ondansetron (ZOFRAN-ODT) 4 MG disintegrating tablet    Sig: Take 1 tablet (4 mg total) by mouth every 8 (eight) hours as needed for nausea or vomiting.    Dispense:  15 tablet    Refill:  0    Discussed typical duration of symptoms for suspected viral GI illness. Will do his best to ensure adequate fluid intake in order to avoid dehydration. Will proceed to the Emergency Department for evaluation if unable to tolerate PO fluids regularly.  Otherwise he will f/u with his PCP or here if not showing improvement over the next 48-72 hours.  Abd pain precautions discussed.  Reviewed expectations re: course of current medical issues. Questions answered. Outlined signs and symptoms indicating need for more acute intervention. Patient verbalized understanding. After Visit Summary given.   SUBJECTIVE: History from: patient.  Joshua Greer is a 25 y.o. male who awoke this morning with n/v; h/o GERD with simlar symptoms; non-bloody; no diarrhea. Afebrile. Mild crampy abd pain. Ambulatory without difficulty. Otherwise well. No known sick contacts. Does not take reflux medicine daily.   Past Surgical History:  Procedure Laterality Date  . HERNIA REPAIR       OBJECTIVE:  Vitals:   08/03/20 0818 08/03/20 0819  BP: 109/73   Pulse: 66   Resp: 18   Temp: 98.2 F (36.8 C) 98.4 F (36.9 C)  TempSrc: Oral Oral  SpO2: 98%     General appearance: alert; no distress Oropharynx: moist Abdomen: soft; non-distended Back: no CVA tenderness Extremities: no edema; symmetrical with no gross deformities Skin: warm; dry Neurologic: normal gait Psychological: alert and cooperative; normal mood and affect    No Known  Allergies                                             Past Medical History:  Diagnosis Date  . Anemia   . Asthma   . History of blood transfusion   . Seasonal allergies    Social History   Socioeconomic History  . Marital status: Single    Spouse name: Not on file  . Number of children: Not on file  . Years of education: Not on file  . Highest education level: Not on file  Occupational History  . Not on file  Tobacco Use  . Smoking status: Never Smoker  . Smokeless tobacco: Never Used  Substance and Sexual Activity  . Alcohol use: No  . Drug use: No  . Sexual activity: Not on file  Other Topics Concern  . Not on file  Social History Narrative  . Not on file   Social Determinants of Health   Financial Resource Strain: Not on file  Food Insecurity: Not on file  Transportation Needs: Not on file  Physical Activity: Not on file  Stress: Not on file  Social Connections: Not on file  Intimate Partner Violence: Not on file   Family History  Problem Relation Age of Onset  . Healthy Mother   . Healthy Father      Mardella Layman, MD 08/03/20 3500    Mardella Layman, MD 08/03/20 [phone]

## 2020-08-03 NOTE — Discharge Instructions (Signed)
Please do your best to ensure adequate fluid intake in order to avoid dehydration. If you find that you are unable to tolerate drinking fluids regularly please proceed to the Emergency Department for evaluation.  Also, you should return to the hospital if you experience persistent fevers for greater than 1-2 more days, increasing abdominal pain that persists despite medications, persistent diarrhea, dizziness, syncope (fainting), or for any other concerns you may find worrisome.  

## 2020-08-16 ENCOUNTER — Ambulatory Visit
Admission: EM | Admit: 2020-08-16 | Discharge: 2020-08-16 | Disposition: A | Discharge status: Home / Self Care | Payer: 59 | Attending: Family Medicine | Admitting: Family Medicine

## 2020-08-16 ENCOUNTER — Other Ambulatory Visit: Payer: Self-pay

## 2020-08-16 DIAGNOSIS — R059 Cough, unspecified: Secondary | ICD-10-CM

## 2020-08-16 DIAGNOSIS — R062 Wheezing: Secondary | ICD-10-CM

## 2020-08-16 DIAGNOSIS — J309 Allergic rhinitis, unspecified: Secondary | ICD-10-CM | POA: Diagnosis not present

## 2020-08-16 MED ORDER — MONTELUKAST SODIUM 10 MG PO TABS: 10.0000 mg | ORAL_TABLET | Freq: Every day | ORAL | 0 refills | Status: DC

## 2020-08-16 MED ORDER — ALBUTEROL SULFATE HFA 108 (90 BASE) MCG/ACT IN AERS: 1.0000 | INHALATION_SPRAY | RESPIRATORY_TRACT | 0 refills | Status: AC | PRN

## 2020-08-16 MED ORDER — PREDNISONE 20 MG PO TABS: 20.0000 mg | ORAL_TABLET | Freq: Every day | ORAL | 0 refills | Status: AC

## 2020-08-16 MED ORDER — PSEUDOEPH-BROMPHEN-DM 30-2-10 MG/5ML PO SYRP: 5.0000 mL | ORAL_SOLUTION | Freq: Four times a day (QID) | ORAL | 0 refills | Status: DC | PRN

## 2020-08-16 NOTE — ED Provider Notes (Signed)
[name]    CSN: [id number] Arrival date & time: 08/16/20  0909      History   Chief Complaint Chief Complaint  Patient presents with  . Wheezing  . Cough    HPI Lyncoln Greer is a 25 y.o. male.   HPI  Patient presents for evaluation for wheezing, cough, and post nasal drainage.   Past Medical History:  Diagnosis Date  . Anemia   . Asthma   . History of blood transfusion   . Seasonal allergies     Patient Active Problem List   Diagnosis Date Noted  . Nasal congestion 12/27/2019    Past Surgical History:  Procedure Laterality Date  . HERNIA REPAIR         Home Medications    Prior to Admission medications   Medication Sig Start Date End Date Taking? Authorizing Provider  albuterol (VENTOLIN HFA) 108 (90 Base) MCG/ACT inhaler Inhale 1-2 puffs into the lungs every 4 (four) hours as needed for wheezing or shortness of breath (cough, shortness of breath or wheezing.). 05/10/20   Daphine Deutscher, Mary-Margaret, FNP  ondansetron (ZOFRAN-ODT) 4 MG disintegrating tablet Take 1 tablet (4 mg total) by mouth every 8 (eight) hours as needed for nausea or vomiting. 08/03/20   Mardella Layman, MD  ALBUTEROL IN Inhale into the lungs.  10/26/19  [provider]    Family History Family History  Problem Relation Age of Onset  . Healthy Mother   . Healthy Father     Social History Social History   Tobacco Use  . Smoking status: Never Smoker  . Smokeless tobacco: Never Used  Substance Use Topics  . Alcohol use: No  . Drug use: No     Allergies   Patient has no known allergies.   Review of Systems Review of Systems Pertinent negatives listed in HPI   Physical Exam Triage Vital Signs ED Triage Vitals [08/16/20 1030]  Enc Vitals Group     BP 123/76     Pulse Rate 75     Resp 18     Temp 99 F (37.2 C)     Temp Source Oral     SpO2 96 %     Weight      Height      Head Circumference      Peak Flow      Pain Score 3     Pain Loc       Pain Edu?      Excl. in GC?    No data found.  Updated Vital Signs BP 123/76 (BP Location: Left Arm)   Pulse 75   Temp 99 F (37.2 C) (Oral)   Resp 18   SpO2 96%   Visual Acuity Right Eye Distance:   Left Eye Distance:   Bilateral Distance:    Right Eye Near:   Left Eye Near:    Bilateral Near:     Physical Exam  General Appearance:    Alert, cooperative, no distress  HENT:   Normocephalic, ears normal, nares mucosal edema with congestion, rhinorrhea present , oropharynx clear   Eyes:    PERRL, conjunctiva/corneas clear, EOM's intact       Lungs:     Clear to auscultation bilaterally , respirations unlabored  Heart:    Regular rate and rhythm  Neurologic:   Awake, alert, oriented x 3. No apparent focal neurological           defect.      UC  Treatments / Results  Labs (all labs ordered are listed, but only abnormal results are displayed) Labs Reviewed - No data to display  EKG   Radiology No results found.  Procedures Procedures (including critical care time)  Medications Ordered in UC Medications - No data to display  Initial Impression / Assessment and Plan / UC Course  I have reviewed the triage vital signs and the nursing notes.  Pertinent labs & imaging results that were available during my care of the patient were reviewed by me and considered in my medical decision making (see chart for details).    Viral respiratory/ allergy symptoms. Symptom management warranted only.  Resume inhaler PRN for wheezing. Return precautions if symptoms worsen. Work note provided. Final Clinical Impressions(s) / UC Diagnoses   Final diagnoses:  Wheezing  Allergic rhinitis, unspecified seasonality, unspecified trigger  Cough   Discharge Instructions   None    ED Prescriptions    Medication Sig Dispense Auth. Provider   brompheniramine-pseudoephedrine-DM 30-2-10 MG/5ML syrup Take 5 mLs by mouth 4 (four) times daily as needed. 120 mL Bing Neighbors, FNP    montelukast (SINGULAIR) 10 MG tablet Take 1 tablet (10 mg total) by mouth at bedtime. 90 tablet Bing Neighbors, FNP   albuterol (VENTOLIN HFA) 108 (90 Base) MCG/ACT inhaler Inhale 1-2 puffs into the lungs every 4 (four) hours as needed for wheezing or shortness of breath (cough, shortness of breath or wheezing.). 8 g Bing Neighbors, FNP   predniSONE (DELTASONE) 20 MG tablet Take 1 tablet (20 mg total) by mouth daily with breakfast for 5 days. 5 tablet Bing Neighbors, FNP     PDMP not reviewed this encounter.   Bing Neighbors, FNP 08/16/20 1122

## 2020-08-16 NOTE — ED Triage Notes (Addendum)
Pt c/o seasonal allergy with wheezing, cough, post nasal drip, and nasal/chest congestion. States out of his inhaler. Pt states needs a work note.

## 2021-01-24 ENCOUNTER — Other Ambulatory Visit: Payer: Self-pay

## 2021-01-24 ENCOUNTER — Encounter: Payer: Self-pay | Admitting: Emergency Medicine

## 2021-01-24 ENCOUNTER — Ambulatory Visit
Admission: EM | Admit: 2021-01-24 | Discharge: 2021-01-24 | Disposition: A | Discharge status: Home / Self Care | Payer: 59 | Attending: Physician Assistant | Admitting: Physician Assistant

## 2021-01-24 DIAGNOSIS — J069 Acute upper respiratory infection, unspecified: Secondary | ICD-10-CM

## 2021-01-24 DIAGNOSIS — Z1152 Encounter for screening for COVID-19: Secondary | ICD-10-CM | POA: Diagnosis not present

## 2021-01-24 MED ORDER — AZITHROMYCIN 250 MG PO TABS: ORAL_TABLET | ORAL | 0 refills | Status: DC

## 2021-01-24 NOTE — ED Triage Notes (Signed)
Cough with green-ish mucus x 3 days. Daughter dx with pneumonia recently.

## 2021-01-24 NOTE — Discharge Instructions (Addendum)
Take antibiotic as prescribed. Will notify of covid results once available. I recommend increased fluids and rest. Encourage follow up with any further concerns.

## 2021-01-24 NOTE — ED Provider Notes (Signed)
[name]    CSN: [id number] Arrival date & time: 01/24/21  0806      History   Chief Complaint Chief Complaint  Patient presents with   Cough    HPI Joshua Greer is a 25 y.o. male.   Patient here today for evaluation of nasal and chest congestion and cough that started to worsen about 3 days ago.  He reports that his daughter was recently diagnosed with early pneumonia.  She is currently taking amoxicillin.  He states he has had some mild sore throat.  He denies any fever or chills.  He has not had any abdominal pain, nausea, vomiting.  He does not report any treatment for symptoms.   Cough Associated symptoms: sore throat   Associated symptoms: no chills, no ear pain, no eye discharge, no fever and no shortness of breath    Past Medical History:  Diagnosis Date   Anemia    Asthma    History of blood transfusion    Seasonal allergies     Patient Active Problem List   Diagnosis Date Noted   Nasal congestion 12/27/2019    Past Surgical History:  Procedure Laterality Date   HERNIA REPAIR         Home Medications    Prior to Admission medications   Medication Sig Start Date End Date Taking? Authorizing Provider  azithromycin (ZITHROMAX Z-PAK) 250 MG tablet Take 2 tabs by mouth day one, then one tab daily for remaining 4 days 01/24/21  Yes Tomi Bamberger, PA-C  albuterol (VENTOLIN HFA) 108 (90 Base) MCG/ACT inhaler Inhale 1-2 puffs into the lungs every 4 (four) hours as needed for wheezing or shortness of breath (cough, shortness of breath or wheezing.). 08/16/20   Bing Neighbors, FNP  brompheniramine-pseudoephedrine-DM 30-2-10 MG/5ML syrup Take 5 mLs by mouth 4 (four) times daily as needed. 08/16/20   Bing Neighbors, FNP  montelukast (SINGULAIR) 10 MG tablet Take 1 tablet (10 mg total) by mouth at bedtime. 08/16/20   Bing Neighbors, FNP  ondansetron (ZOFRAN-ODT) 4 MG disintegrating tablet Take 1 tablet (4 mg total) by mouth every 8 (eight)  hours as needed for nausea or vomiting. 08/03/20   Mardella Layman, MD  ALBUTEROL IN Inhale into the lungs.  10/26/19  [provider]    Family History Family History  Problem Relation Age of Onset   Healthy Mother    Healthy Father     Social History Social History   Tobacco Use   Smoking status: Never   Smokeless tobacco: Never  Substance Use Topics   Alcohol use: No   Drug use: No     Allergies   Patient has no known allergies.   Review of Systems Review of Systems  Constitutional:  Negative for chills and fever.  HENT:  Positive for congestion and sore throat. Negative for ear pain.   Eyes:  Negative for discharge and redness.  Respiratory:  Positive for cough. Negative for shortness of breath.   Gastrointestinal:  Negative for abdominal pain, nausea and vomiting.    Physical Exam Triage Vital Signs ED Triage Vitals [01/24/21 0818]  Enc Vitals Group     BP 118/75     Pulse Rate (!) 57     Resp 16     Temp 98.6 F (37 C)     Temp Source Oral     SpO2 97 %     Weight      Height  Head Circumference      Peak Flow      Pain Score 7     Pain Loc      Pain Edu?      Excl. in GC?    No data found.  Updated Vital Signs BP 118/75 (BP Location: Left Arm)   Pulse (!) 57   Temp 98.6 F (37 C) (Oral)   Resp 16   SpO2 97%      Physical Exam Vitals and nursing note reviewed.  Constitutional:      General: He is not in acute distress.    Appearance: Normal appearance. He is not ill-appearing.  HENT:     Head: Normocephalic and atraumatic.     Right Ear: Tympanic membrane normal.     Left Ear: Tympanic membrane normal.     Nose: Congestion (mild) present.     Mouth/Throat:     Mouth: Mucous membranes are moist.     Pharynx: Oropharynx is clear. No oropharyngeal exudate or posterior oropharyngeal erythema.  Eyes:     Conjunctiva/sclera: Conjunctivae normal.  Cardiovascular:     Rate and Rhythm: Normal rate and regular rhythm.     Heart  sounds: Normal heart sounds. No murmur heard. Pulmonary:     Effort: Pulmonary effort is normal. No respiratory distress.     Breath sounds: Normal breath sounds. No wheezing, rhonchi or rales.  Skin:    General: Skin is warm and dry.  Neurological:     Mental Status: He is alert.  Psychiatric:        Mood and Affect: Mood normal.        Thought Content: Thought content normal.     UC Treatments / Results  Labs (all labs ordered are listed, but only abnormal results are displayed) Labs Reviewed  NOVEL CORONAVIRUS, NAA    EKG   Radiology No results found.  Procedures Procedures (including critical care time)  Medications Ordered in UC Medications - No data to display  Initial Impression / Assessment and Plan / UC Course  I have reviewed the triage vital signs and the nursing notes.  Pertinent labs & imaging results that were available during my care of the patient were reviewed by me and considered in my medical decision making (see chart for details).  Suspect most likely viral etiology of symptoms, and COVID screening ordered.  Given reported exposure to pneumonia, but lack of fever will treat to cover atypical pneumonia with Z-Pak.  Encouraged follow-up if symptoms fail to improve or worsen.  Final Clinical Impressions(s) / UC Diagnoses   Final diagnoses:  Encounter for screening for COVID-19  Acute upper respiratory infection     Discharge Instructions      Take antibiotic as prescribed. Will notify of covid results once available. I recommend increased fluids and rest. Encourage follow up with any further concerns.      ED Prescriptions     Medication Sig Dispense Auth. Provider   azithromycin (ZITHROMAX Z-PAK) 250 MG tablet Take 2 tabs by mouth day one, then one tab daily for remaining 4 days 6 tablet Tomi Bamberger, PA-C      PDMP not reviewed this encounter.   Tomi Bamberger, PA-C 01/24/21 [phone]

## 2021-01-25 LAB — SARS-COV-2, NAA 2 DAY TAT

## 2021-01-25 LAB — NOVEL CORONAVIRUS, NAA: SARS-CoV-2, NAA: NOT DETECTED

## 2021-01-29 ENCOUNTER — Other Ambulatory Visit: Payer: Self-pay

## 2021-01-29 ENCOUNTER — Encounter: Payer: Self-pay | Admitting: Emergency Medicine

## 2021-01-29 ENCOUNTER — Ambulatory Visit
Admission: EM | Admit: 2021-01-29 | Discharge: 2021-01-29 | Disposition: A | Discharge status: Home / Self Care | Payer: 59 | Attending: Urgent Care | Admitting: Urgent Care

## 2021-01-29 DIAGNOSIS — J069 Acute upper respiratory infection, unspecified: Secondary | ICD-10-CM | POA: Diagnosis not present

## 2021-01-29 DIAGNOSIS — J4530 Mild persistent asthma, uncomplicated: Secondary | ICD-10-CM | POA: Diagnosis not present

## 2021-01-29 DIAGNOSIS — J3089 Other allergic rhinitis: Secondary | ICD-10-CM

## 2021-01-29 DIAGNOSIS — R0982 Postnasal drip: Secondary | ICD-10-CM

## 2021-01-29 MED ORDER — FLUTICASONE PROPIONATE 50 MCG/ACT NA SUSP: 2.0000 | Freq: Every day | NASAL | 12 refills | Status: AC

## 2021-01-29 MED ORDER — PREDNISONE 20 MG PO TABS: ORAL_TABLET | ORAL | 0 refills | Status: DC

## 2021-01-29 MED ORDER — CETIRIZINE HCL 10 MG PO TABS: 10.0000 mg | ORAL_TABLET | Freq: Every day | ORAL | 0 refills | Status: DC

## 2021-01-29 NOTE — ED Provider Notes (Signed)
[facility]   MRN: [id number] DOB: 1995/12/30  Subjective:   Joshua Greer is a 25 y.o. male presenting for 2-day history of persistent postnasal drainage, sinus congestion.  Patient states that he usually does well with the prednisone course.  He has allergic rhinitis and asthma.  He is not taking an antihistamine, Singulair.  He does have an albuterol inhaler that he uses as needed.  Last week he was prescribed azithromycin and improve the cough significantly.  No longer has any respiratory symptoms.  No fever, chest pain, shortness of breath, body aches.  No current facility-administered medications for this encounter.  Current Outpatient Medications:    albuterol (VENTOLIN HFA) 108 (90 Base) MCG/ACT inhaler, Inhale 1-2 puffs into the lungs every 4 (four) hours as needed for wheezing or shortness of breath (cough, shortness of breath or wheezing.)., Disp: 8 g, Rfl: 0   azithromycin (ZITHROMAX Z-PAK) 250 MG tablet, Take 2 tabs by mouth day one, then one tab daily for remaining 4 days, Disp: 6 tablet, Rfl: 0   brompheniramine-pseudoephedrine-DM 30-2-10 MG/5ML syrup, Take 5 mLs by mouth 4 (four) times daily as needed., Disp: 120 mL, Rfl: 0   montelukast (SINGULAIR) 10 MG tablet, Take 1 tablet (10 mg total) by mouth at bedtime., Disp: 90 tablet, Rfl: 0   ondansetron (ZOFRAN-ODT) 4 MG disintegrating tablet, Take 1 tablet (4 mg total) by mouth every 8 (eight) hours as needed for nausea or vomiting., Disp: 15 tablet, Rfl: 0   No Known Allergies  Past Medical History:  Diagnosis Date   Anemia    Asthma    History of blood transfusion    Seasonal allergies      Past Surgical History:  Procedure Laterality Date   HERNIA REPAIR      Family History  Problem Relation Age of Onset   Healthy Mother    Healthy Father     Social History   Tobacco Use   Smoking status: Never   Smokeless tobacco: Never  Substance Use Topics   Alcohol use: No   Drug use: No     ROS   Objective:   Vitals: BP 109/72 (BP Location: Right Arm)   Pulse 63   Temp 98.3 F (36.8 C) (Oral)   Resp 18   SpO2 97%   Physical Exam Constitutional:      General: He is not in acute distress.    Appearance: Normal appearance. He is well-developed and normal weight. He is not ill-appearing, toxic-appearing or diaphoretic.  HENT:     Head: Normocephalic and atraumatic.     Right Ear: Tympanic membrane, ear canal and external ear normal. There is no impacted cerumen.     Left Ear: Tympanic membrane, ear canal and external ear normal. There is no impacted cerumen.     Nose: Congestion and rhinorrhea present.     Mouth/Throat:     Mouth: Mucous membranes are moist.     Pharynx: No oropharyngeal exudate or posterior oropharyngeal erythema.  Eyes:     General: No scleral icterus.       Right eye: No discharge.        Left eye: No discharge.     Extraocular Movements: Extraocular movements intact.     Conjunctiva/sclera: Conjunctivae normal.     Pupils: Pupils are equal, round, and reactive to light.  Cardiovascular:     Rate and Rhythm: Normal rate and regular rhythm.     Heart sounds: Normal heart sounds. No murmur heard.  No friction rub. No gallop.  Pulmonary:     Effort: Pulmonary effort is normal. No respiratory distress.     Breath sounds: Normal breath sounds. No stridor. No wheezing, rhonchi or rales.  Musculoskeletal:     Cervical back: Normal range of motion and neck supple. No rigidity. No muscular tenderness.  Neurological:     General: No focal deficit present.     Mental Status: He is alert and oriented to person, place, and time.  Psychiatric:        Mood and Affect: Mood normal.        Behavior: Behavior normal.        Thought Content: Thought content normal.        Judgment: Judgment normal.    Assessment and Plan :   PDMP not reviewed this encounter.  1. Viral upper respiratory illness   2. Post-nasal drainage   3. Allergic rhinitis  due to other allergic trigger, unspecified seasonality   4. Mild persistent asthma without complication     We will hold off on prescribing further antibiotics.  Recommended an oral steroid course, use of Zyrtec and then Flonase once he is done with the prednisone course.  Use supportive care otherwise.  Patient has a clear cardiopulmonary exam and therefore deferred imaging.  Counseled patient on potential for adverse effects with medications prescribed/recommended today, ER and return-to-clinic precautions discussed, patient verbalized understanding.    Wallis Bamberg, [place] 01/29/21 [phone]

## 2021-01-29 NOTE — ED Triage Notes (Signed)
Was given a Z-pack last Wednesday. Cough has cleared up but now having nasal drainage that causes him to be nauseous

## 2021-02-12 ENCOUNTER — Other Ambulatory Visit: Payer: Self-pay

## 2021-02-12 ENCOUNTER — Ambulatory Visit
Admission: EM | Admit: 2021-02-12 | Discharge: 2021-02-12 | Disposition: A | Discharge status: Home / Self Care | Payer: Self-pay | Attending: Physician Assistant | Admitting: Physician Assistant

## 2021-02-12 ENCOUNTER — Encounter: Payer: Self-pay | Admitting: Emergency Medicine

## 2021-02-12 DIAGNOSIS — G43809 Other migraine, not intractable, without status migrainosus: Secondary | ICD-10-CM

## 2021-02-12 MED ORDER — ONDANSETRON 4 MG PO TBDP: 4.0000 mg | ORAL_TABLET | Freq: Three times a day (TID) | ORAL | 0 refills | Status: DC | PRN

## 2021-02-12 MED ORDER — KETOROLAC TROMETHAMINE 60 MG/2ML IM SOLN
60.0000 mg | Freq: Once | INTRAMUSCULAR | Status: AC
  Administered 2021-02-12: 60 mg via INTRAMUSCULAR

## 2021-02-12 NOTE — ED Provider Notes (Signed)
[name]    CSN: [id number] Arrival date & time: 02/12/21  1205      History   Chief Complaint Chief Complaint  Patient presents with   Headache    HPI Joshua Greer is a 25 y.o. male.   Patient here today for evaluation of headache that started a few days ago and has seemed to progressively worsen. He states last night driving seemed to be the worst. He has had some photophobia. He states he has a history of migraines. He has had some associated nausea, but no vomiting or diarrhea. He has not had any other symptoms. He has tried motrin without significant relief.   The history is provided by the patient.  Headache Associated symptoms: nausea and photophobia   Associated symptoms: no congestion, no cough, no diarrhea, no fever, no sore throat and no vomiting    Past Medical History:  Diagnosis Date   Anemia    Asthma    History of blood transfusion    Seasonal allergies     Patient Active Problem List   Diagnosis Date Noted   Nasal congestion 12/27/2019    Past Surgical History:  Procedure Laterality Date   HERNIA REPAIR         Home Medications    Prior to Admission medications   Medication Sig Start Date End Date Taking? Authorizing Provider  albuterol (VENTOLIN HFA) 108 (90 Base) MCG/ACT inhaler Inhale 1-2 puffs into the lungs every 4 (four) hours as needed for wheezing or shortness of breath (cough, shortness of breath or wheezing.). 08/16/20  Yes Bing Neighbors, FNP  azithromycin (ZITHROMAX Z-PAK) 250 MG tablet Take 2 tabs by mouth day one, then one tab daily for remaining 4 days 01/24/21  Yes Tomi Bamberger, PA-C  brompheniramine-pseudoephedrine-DM 30-2-10 MG/5ML syrup Take 5 mLs by mouth 4 (four) times daily as needed. 08/16/20  Yes Bing Neighbors, FNP  cetirizine (ZYRTEC ALLERGY) 10 MG tablet Take 1 tablet (10 mg total) by mouth daily. 01/29/21  Yes Wallis Bamberg, PA-C  fluticasone (FLONASE) 50 MCG/ACT nasal spray Place 2 sprays into  both nostrils daily. 01/29/21  Yes Wallis Bamberg, PA-C  montelukast (SINGULAIR) 10 MG tablet Take 1 tablet (10 mg total) by mouth at bedtime. 08/16/20  Yes Bing Neighbors, FNP  ondansetron (ZOFRAN-ODT) 4 MG disintegrating tablet Take 1 tablet (4 mg total) by mouth every 8 (eight) hours as needed for nausea or vomiting. 02/12/21  Yes Tomi Bamberger, PA-C  predniSONE (DELTASONE) 20 MG tablet Take 2 tablets daily with breakfast. 01/29/21  Yes Wallis Bamberg, PA-C  ALBUTEROL IN Inhale into the lungs.  10/26/19  [provider]    Family History Family History  Problem Relation Age of Onset   Healthy Mother    Healthy Father     Social History Social History   Tobacco Use   Smoking status: Never   Smokeless tobacco: Never  Substance Use Topics   Alcohol use: No   Drug use: No     Allergies   Patient has no known allergies.   Review of Systems Review of Systems  Constitutional:  Negative for chills and fever.  HENT:  Negative for congestion and sore throat.   Eyes:  Positive for photophobia. Negative for discharge and redness.  Respiratory:  Negative for cough and shortness of breath.   Gastrointestinal:  Positive for nausea. Negative for diarrhea and vomiting.  Neurological:  Positive for headaches.    Physical Exam Triage Vital Signs  ED Triage Vitals [02/12/21 1221]  Enc Vitals Group     BP 119/70     Pulse Rate 79     Resp      Temp 98.5 F (36.9 C)     Temp Source Oral     SpO2 97 %     Weight 130 lb (59 kg)     Height 5\' 5"  (1.651 m)     Head Circumference      Peak Flow      Pain Score 8     Pain Loc      Pain Edu?      Excl. in GC?    No data found.  Updated Vital Signs BP 119/70 (BP Location: Right Arm)   Pulse 79   Temp 98.5 F (36.9 C) (Oral)   Ht 5\' 5"  (1.651 m)   Wt 130 lb (59 kg)   SpO2 97%   BMI 21.63 kg/m      Physical Exam Vitals and nursing note reviewed.  Constitutional:      General: He is not in acute distress.     Appearance: He is well-developed. He is not ill-appearing.  HENT:     Head: Normocephalic and atraumatic.  Eyes:     Pupils: Pupils are equal, round, and reactive to light.  Cardiovascular:     Rate and Rhythm: Normal rate.  Pulmonary:     Effort: Pulmonary effort is normal.  Neurological:     Mental Status: He is alert.  Psychiatric:        Mood and Affect: Mood normal.        Behavior: Behavior normal.     UC Treatments / Results  Labs (all labs ordered are listed, but only abnormal results are displayed) Labs Reviewed - No data to display  EKG   Radiology No results found.  Procedures Procedures (including critical care time)  Medications Ordered in UC Medications  ketorolac (TORADOL) injection 60 mg (60 mg Intramuscular Given 02/12/21 1312)    Initial Impression / Assessment and Plan / UC Course  I have reviewed the triage vital signs and the nursing notes.  Pertinent labs & imaging results that were available during my care of the patient were reviewed by me and considered in my medical decision making (see chart for details).   Toradol injection administered in office and rx sent for zofran. Recommended follow up with any further concerns. Encouraged ED with any worsening headache.   Final Clinical Impressions(s) / UC Diagnoses   Final diagnoses:  Other migraine without status migrainosus, not intractable     Discharge Instructions      Follow up with any further concerns.      ED Prescriptions     Medication Sig Dispense Auth. Provider   ondansetron (ZOFRAN-ODT) 4 MG disintegrating tablet Take 1 tablet (4 mg total) by mouth every 8 (eight) hours as needed for nausea or vomiting. 20 tablet , PA-C      PDMP not reviewed this encounter.   04/14/21, PA-C 02/12/21 1338

## 2021-02-12 NOTE — Discharge Instructions (Addendum)
Follow up with any further concerns.  

## 2021-02-12 NOTE — ED Triage Notes (Signed)
Patient c/o sinus headache and pressure x 3-4 days.  Patient has taken Motrin for headache.

## 2021-02-15 ENCOUNTER — Emergency Department (HOSPITAL_COMMUNITY)
Admission: EM | Admit: 2021-02-15 | Discharge: 2021-02-15 | Disposition: A | Discharge status: Home / Self Care | Payer: 59 | Attending: Emergency Medicine | Admitting: Emergency Medicine

## 2021-02-15 ENCOUNTER — Other Ambulatory Visit: Payer: Self-pay

## 2021-02-15 ENCOUNTER — Ambulatory Visit
Admission: EM | Admit: 2021-02-15 | Discharge: 2021-02-15 | Disposition: A | Discharge status: Home / Self Care | Payer: 59

## 2021-02-15 ENCOUNTER — Emergency Department (HOSPITAL_COMMUNITY): Payer: 59

## 2021-02-15 ENCOUNTER — Encounter: Payer: Self-pay | Admitting: Emergency Medicine

## 2021-02-15 ENCOUNTER — Encounter (HOSPITAL_COMMUNITY): Payer: Self-pay | Admitting: Pharmacy Technician

## 2021-02-15 DIAGNOSIS — J4521 Mild intermittent asthma with (acute) exacerbation: Secondary | ICD-10-CM | POA: Diagnosis not present

## 2021-02-15 DIAGNOSIS — Z7952 Long term (current) use of systemic steroids: Secondary | ICD-10-CM | POA: Diagnosis not present

## 2021-02-15 DIAGNOSIS — Z20822 Contact with and (suspected) exposure to covid-19: Secondary | ICD-10-CM | POA: Diagnosis not present

## 2021-02-15 DIAGNOSIS — R0602 Shortness of breath: Secondary | ICD-10-CM

## 2021-02-15 DIAGNOSIS — R051 Acute cough: Secondary | ICD-10-CM

## 2021-02-15 LAB — URINALYSIS, ROUTINE W REFLEX MICROSCOPIC
Bilirubin Urine: NEGATIVE
Glucose, UA: 50 mg/dL — AB
Hgb urine dipstick: NEGATIVE
Ketones, ur: NEGATIVE mg/dL
Leukocytes,Ua: NEGATIVE
Nitrite: NEGATIVE
Protein, ur: 30 mg/dL — AB
Specific Gravity, Urine: 1.029 (ref 1.005–1.030)
pH: 6 (ref 5.0–8.0)

## 2021-02-15 LAB — CBC WITH DIFFERENTIAL/PLATELET
Abs Immature Granulocytes: 0.07 10*3/uL (ref 0.00–0.07)
Basophils Absolute: 0.1 10*3/uL (ref 0.0–0.1)
Basophils Relative: 1 %
Eosinophils Absolute: 0.1 10*3/uL (ref 0.0–0.5)
Eosinophils Relative: 1 %
HCT: 44 % (ref 39.0–52.0)
Hemoglobin: 15.2 g/dL (ref 13.0–17.0)
Immature Granulocytes: 0 %
Lymphocytes Relative: 7 %
Lymphs Abs: 1.2 10*3/uL (ref 0.7–4.0)
MCH: 30.6 pg (ref 26.0–34.0)
MCHC: 34.5 g/dL (ref 30.0–36.0)
MCV: 88.5 fL (ref 80.0–100.0)
Monocytes Absolute: 1.7 10*3/uL — ABNORMAL HIGH (ref 0.1–1.0)
Monocytes Relative: 10 %
Neutro Abs: 13.8 10*3/uL — ABNORMAL HIGH (ref 1.7–7.7)
Neutrophils Relative %: 81 %
Platelets: 168 10*3/uL (ref 150–400)
RBC: 4.97 MIL/uL (ref 4.22–5.81)
RDW: 13 % (ref 11.5–15.5)
WBC: 17 10*3/uL — ABNORMAL HIGH (ref 4.0–10.5)
nRBC: 0 % (ref 0.0–0.2)

## 2021-02-15 LAB — COMPREHENSIVE METABOLIC PANEL
ALT: 15 U/L (ref 0–44)
AST: 23 U/L (ref 15–41)
Albumin: 4.1 g/dL (ref 3.5–5.0)
Alkaline Phosphatase: 58 U/L (ref 38–126)
Anion gap: 10 (ref 5–15)
BUN: 11 mg/dL (ref 6–20)
CO2: 23 mmol/L (ref 22–32)
Calcium: 9.3 mg/dL (ref 8.9–10.3)
Chloride: 106 mmol/L (ref 98–111)
Creatinine, Ser: 0.96 mg/dL (ref 0.61–1.24)
GFR, Estimated: >60 mL/min (ref >60)
Glucose, Bld: 106 mg/dL — ABNORMAL HIGH (ref 70–99)
Potassium: 3.5 mmol/L (ref 3.5–5.1)
Sodium: 139 mmol/L (ref 135–145)
Total Bilirubin: 1 mg/dL (ref 0.3–1.2)
Total Protein: 7.3 g/dL (ref 6.5–8.1)

## 2021-02-15 LAB — LIPASE, BLOOD: Lipase: 20 U/L (ref 11–51)

## 2021-02-15 LAB — RESP PANEL BY RT-PCR (FLU A&B, COVID) ARPGX2
Influenza A by PCR: NEGATIVE
Influenza B by PCR: NEGATIVE
SARS Coronavirus 2 by RT PCR: NEGATIVE

## 2021-02-15 MED ORDER — ONDANSETRON 4 MG PO TBDP: 4.0000 mg | ORAL_TABLET | Freq: Three times a day (TID) | ORAL | 0 refills | Status: DC | PRN

## 2021-02-15 MED ORDER — PREDNISONE 50 MG PO TABS: 50.0000 mg | ORAL_TABLET | Freq: Every day | ORAL | 0 refills | Status: AC

## 2021-02-15 MED ORDER — PREDNISONE 20 MG PO TABS
60.0000 mg | ORAL_TABLET | Freq: Once | ORAL | Status: AC
  Administered 2021-02-15: 60 mg via ORAL
  Filled 2021-02-15: qty 3

## 2021-02-15 MED ORDER — ATROVENT HFA 17 MCG/ACT IN AERS: 2.0000 | INHALATION_SPRAY | Freq: Four times a day (QID) | RESPIRATORY_TRACT | 12 refills | Status: AC | PRN

## 2021-02-15 MED ORDER — IPRATROPIUM-ALBUTEROL 0.5-2.5 (3) MG/3ML IN SOLN
3.0000 mL | Freq: Once | RESPIRATORY_TRACT | Status: AC
  Administered 2021-02-15: 3 mL via RESPIRATORY_TRACT
  Filled 2021-02-15: qty 3

## 2021-02-15 NOTE — ED Triage Notes (Signed)
Patient c/o SOB, cough, middle of his chest feels on fire.  Been up since 2am with difficulty breathing.

## 2021-02-15 NOTE — Discharge Instructions (Addendum)
please go to the hospital as soon as you leave urgent care for further evaluation and management.

## 2021-02-15 NOTE — ED Notes (Signed)
Pt verbalized understanding of d/c instructions, meds and followup care. Denies questions. VSS, no distress noted. Steady gait to exit with all belongings.  ?

## 2021-02-15 NOTE — ED Provider Notes (Signed)
[name]    CSN: [id number] Arrival date & time: 02/15/21  0801      History   Chief Complaint Chief Complaint  Patient presents with   Cough   Shortness of Breath    HPI Joshua Greer is a 25 y.o. male.   Patient presents with shortness of breath, cough, runny nose that started approximately 1 to 2 days ago.  Patient reports that shortness of breath worsened in the middle of the night and he "has not been able to breathe all night".  Patient took an albuterol nebulizer treatment at approximately 9 PM last night that did not provide any improvement.  Patient denies any chest pain.  Denies any known fevers or sick contacts.  Patient does report history of asthma.  Patient has been seen multiple times at this urgent care for upper respiratory symptoms and asthma exacerbations.   Cough Shortness of Breath  Past Medical History:  Diagnosis Date   Anemia    Asthma    History of blood transfusion    Seasonal allergies     Patient Active Problem List   Diagnosis Date Noted   Nasal congestion 12/27/2019    Past Surgical History:  Procedure Laterality Date   HERNIA REPAIR         Home Medications    Prior to Admission medications   Medication Sig Start Date End Date Taking? Authorizing Provider  albuterol (VENTOLIN HFA) 108 (90 Base) MCG/ACT inhaler Inhale 1-2 puffs into the lungs every 4 (four) hours as needed for wheezing or shortness of breath (cough, shortness of breath or wheezing.). 08/16/20  Yes Bing Neighbors, FNP  azithromycin (ZITHROMAX Z-PAK) 250 MG tablet Take 2 tabs by mouth day one, then one tab daily for remaining 4 days 01/24/21  Yes Tomi Bamberger, PA-C  brompheniramine-pseudoephedrine-DM 30-2-10 MG/5ML syrup Take 5 mLs by mouth 4 (four) times daily as needed. 08/16/20  Yes Bing Neighbors, FNP  cetirizine (ZYRTEC ALLERGY) 10 MG tablet Take 1 tablet (10 mg total) by mouth daily. 01/29/21  Yes Wallis Bamberg, PA-C  fluticasone (FLONASE)  50 MCG/ACT nasal spray Place 2 sprays into both nostrils daily. 01/29/21  Yes Wallis Bamberg, PA-C  montelukast (SINGULAIR) 10 MG tablet Take 1 tablet (10 mg total) by mouth at bedtime. 08/16/20  Yes Bing Neighbors, FNP  ondansetron (ZOFRAN-ODT) 4 MG disintegrating tablet Take 1 tablet (4 mg total) by mouth every 8 (eight) hours as needed for nausea or vomiting. 02/12/21  Yes Tomi Bamberger, PA-C  predniSONE (DELTASONE) 20 MG tablet Take 2 tablets daily with breakfast. 01/29/21  Yes Wallis Bamberg, PA-C  ALBUTEROL IN Inhale into the lungs.  10/26/19  [provider]    Family History Family History  Problem Relation Age of Onset   Healthy Mother    Healthy Father     Social History Social History   Tobacco Use   Smoking status: Never   Smokeless tobacco: Never  Substance Use Topics   Alcohol use: No   Drug use: No     Allergies   Patient has no known allergies.   Review of Systems Review of Systems Per HPI  Physical Exam Triage Vital Signs ED Triage Vitals  Enc Vitals Group     BP 02/15/21 0813 103/68     Pulse Rate 02/15/21 0813 80     Resp 02/15/21 0813 18     Temp 02/15/21 0813 98.7 F (37.1 C)     Temp Source 02/15/21  0813 Oral     SpO2 02/15/21 0813 92 %     Weight --      Height --      Head Circumference --      Peak Flow --      Pain Score 02/15/21 0814 8     Pain Loc --      Pain Edu? --      Excl. in GC? --    No data found.  Updated Vital Signs BP 103/68 (BP Location: Left Arm)   Pulse 80   Temp 98.7 F (37.1 C) (Oral)   Resp 18   SpO2 92%   Visual Acuity Right Eye Distance:   Left Eye Distance:   Bilateral Distance:    Right Eye Near:   Left Eye Near:    Bilateral Near:     Physical Exam Constitutional:      General: He is not in acute distress.    Appearance: Normal appearance. He is ill-appearing. He is not toxic-appearing or diaphoretic.  HENT:     Head: Normocephalic and atraumatic.     Right Ear: Tympanic membrane  and ear canal normal.     Left Ear: Tympanic membrane and ear canal normal.     Nose: Rhinorrhea present. Rhinorrhea is clear.     Mouth/Throat:     Mouth: Mucous membranes are moist.     Pharynx: No posterior oropharyngeal erythema.  Eyes:     Extraocular Movements: Extraocular movements intact.     Conjunctiva/sclera: Conjunctivae normal.     Pupils: Pupils are equal, round, and reactive to light.  Cardiovascular:     Rate and Rhythm: Normal rate and regular rhythm.     Pulses: Normal pulses.     Heart sounds: Normal heart sounds.  Pulmonary:     Effort: Pulmonary effort is normal. No respiratory distress.     Breath sounds: Wheezing present.  Abdominal:     General: Abdomen is flat. Bowel sounds are normal.     Palpations: Abdomen is soft.  Musculoskeletal:        General: Normal range of motion.     Cervical back: Normal range of motion.  Skin:    General: Skin is warm and dry.  Neurological:     General: No focal deficit present.     Mental Status: He is alert and oriented to person, place, and time. Mental status is at baseline.  Psychiatric:        Mood and Affect: Mood normal.        Behavior: Behavior normal.     UC Treatments / Results  Labs (all labs ordered are listed, but only abnormal results are displayed) Labs Reviewed - No data to display  EKG   Radiology No results found.  Procedures Procedures (including critical care time)  Medications Ordered in UC Medications - No data to display  Initial Impression / Assessment and Plan / UC Course  I have reviewed the triage vital signs and the nursing notes.  Pertinent labs & imaging results that were available during my care of the patient were reviewed by me and considered in my medical decision making (see chart for details).     Patient's oxygen saturation was ranging from 88 to 91% during triage and during physical exam.  Due to low oxygen saturation and shortness of breath, patient was advised  to go to the hospital for further evaluation and management.  Unable to complete nebulizer treatments in urgent care due to  our policy.  Patient was agreeable with plan.  Patient was advised that EMS transport would be best due to low oxygen but patient declined.  Risks associated with not going to the hospital via EMS were discussed with patient.  Patient wished to transfer himself to the hospital. Final Clinical Impressions(s) / UC Diagnoses   Final diagnoses:  Shortness of breath  Acute cough     Discharge Instructions      please go to the hospital as soon as you leave urgent care for further evaluation and management.     ED Prescriptions   None    PDMP not reviewed this encounter.   Lance Muss, FNP 02/15/21 [phone]

## 2021-02-15 NOTE — ED Provider Notes (Signed)
Emergency Medicine Provider Triage Evaluation Note  Joshua Greer , a 25 y.o. male  was evaluated in triage.  Pt complains of multiple complaints. Here for HA, cough, sob, abd pain, emesis, myalgias. Seen at [facility] noted oxygen to be in low 90's sent here for further evaluation. Coughing up green however improved since breathing treatment at home. Some gen low back, flank pain without urinary sx. Emesis NBNB. Not vaccinated. Hx of asthma  Review of Systems  Positive: Cough, myalgias, sob, abd pain, emesis, ha Negative: Numbness, weakness, incontinence  Physical Exam  BP 118/81   Pulse 83   Temp 99.6 F (37.6 C)   Resp 19   SpO2 97%  Gen:   Awake, no distress   Resp:  Normal effort, minimal expiratory wheeze MSK:   Moves extremities without difficulty  Other:    Medical Decision Making  Medically screening exam initiated at 9:34 AM.  Appropriate orders placed.  Brant Peets was informed that the remainder of the evaluation will be completed by another provider, this initial triage assessment does not replace that evaluation, and the importance of remaining in the ED until their evaluation is complete.  Stable VS. Work up started   [employer] 02/15/21 0762    Tegeler, Canary Brim, MD 02/15/21 1110

## 2021-02-15 NOTE — ED Provider Notes (Addendum)
[name]: [facility] EMERGENCY DEPARTMENT Provider Note   CSN: [id number] Arrival date & time: 02/15/21  1751     History No chief complaint on file.   Joshua Greer is a 25 y.o. male who presents from UC with concern for SOB and chest tightness x 3 days.  Patient with history of asthma who endorses yearly problems around this time with season change and exacerbations of his asthma.  Was recently seen at urgent care who told him to start on Zyrtec, Flonase, and discharged home with steroid burst.  He recently did complete a Z-Pak as well.  He does endorse that his child has been sick at home; the patient has vomited multiple times last 24 hours, NBNB emesis.  States that the only thing that helped his symptoms at home was his nebulizer treatments.  I have personally reviewed this patient's medical records.  He takes Flonase daily and has an albuterol inhaler as needed.  HPI     Past Medical History:  Diagnosis Date   Anemia    Asthma    History of blood transfusion    Seasonal allergies     Patient Active Problem List   Diagnosis Date Noted   Nasal congestion 12/27/2019    Past Surgical History:  Procedure Laterality Date   HERNIA REPAIR         Family History  Problem Relation Age of Onset   Healthy Mother    Healthy Father     Social History   Tobacco Use   Smoking status: Never   Smokeless tobacco: Never  Substance Use Topics   Alcohol use: No   Drug use: No    Home Medications Prior to Admission medications   Medication Sig Start Date End Date Taking? Authorizing Provider  albuterol (VENTOLIN HFA) 108 (90 Base) MCG/ACT inhaler Inhale 1-2 puffs into the lungs every 4 (four) hours as needed for wheezing or shortness of breath (cough, shortness of breath or wheezing.). 08/16/20  Yes Bing Neighbors, FNP  fluticasone (FLONASE) 50 MCG/ACT nasal spray Place 2 sprays into both nostrils daily. 01/29/21  Yes Wallis Bamberg, PA-C  ipratropium  (ATROVENT HFA) 17 MCG/ACT inhaler Inhale 2 puffs into the lungs every 6 (six) hours as needed for wheezing. 02/15/21  Yes Emaline Karnes R, PA-C  ondansetron (ZOFRAN ODT) 4 MG disintegrating tablet Take 1 tablet (4 mg total) by mouth every 8 (eight) hours as needed for nausea or vomiting. 02/15/21  Yes Zarriah Starkel, Eugene Gavia, PA-C  predniSONE (DELTASONE) 50 MG tablet Take 1 tablet (50 mg total) by mouth daily with breakfast for 5 days. 02/15/21 02/20/21 Yes Finnick Orosz, Lupe Carney R, PA-C  azithromycin (ZITHROMAX Z-PAK) 250 MG tablet Take 2 tabs by mouth day one, then one tab daily for remaining 4 days Patient not taking: No sig reported 01/24/21   Tomi Bamberger, PA-C  brompheniramine-pseudoephedrine-DM 30-2-10 MG/5ML syrup Take 5 mLs by mouth 4 (four) times daily as needed. Patient not taking: Reported on 02/15/2021 08/16/20   Bing Neighbors, FNP  cetirizine (ZYRTEC ALLERGY) 10 MG tablet Take 1 tablet (10 mg total) by mouth daily. Patient not taking: Reported on 02/15/2021 01/29/21   Wallis Bamberg, PA-C  montelukast (SINGULAIR) 10 MG tablet Take 1 tablet (10 mg total) by mouth at bedtime. Patient not taking: No sig reported 08/16/20   Bing Neighbors, FNP  ALBUTEROL IN Inhale into the lungs.  10/26/19  [provider]    Allergies    Patient has no known allergies.  Review of Systems   Review of Systems  Constitutional:  Positive for chills and fatigue. Negative for activity change.  HENT: Negative.    Respiratory:  Positive for cough, chest tightness and shortness of breath.   Cardiovascular: Negative.   Gastrointestinal:  Positive for nausea and vomiting. Negative for abdominal pain and diarrhea.  Genitourinary:  Positive for flank pain. Negative for dysuria, hematuria and urgency.  Skin: Negative.   Neurological: Negative.   Psychiatric/Behavioral: Negative.     Physical Exam Updated Vital Signs BP 113/81   Pulse 80   Temp 99.1 F (37.3 C) (Oral)   Resp 20   SpO2  100%   Physical Exam Vitals and nursing note reviewed.  Constitutional:      General: He is not in acute distress.    Appearance: He is not toxic-appearing.  HENT:     Head: Normocephalic and atraumatic.     Nose: Nose normal.     Mouth/Throat:     Mouth: Mucous membranes are moist.     Pharynx: Oropharynx is clear. Uvula midline. No oropharyngeal exudate or posterior oropharyngeal erythema.     Tonsils: No tonsillar exudate.  Eyes:     General: Lids are normal. Vision grossly intact.        Right eye: No discharge.        Left eye: No discharge.     Extraocular Movements: Extraocular movements intact.     Conjunctiva/sclera: Conjunctivae normal.     Pupils: Pupils are equal, round, and reactive to light.  Neck:     Trachea: Trachea and phonation normal.  Cardiovascular:     Rate and Rhythm: Normal rate and regular rhythm.     Pulses: Normal pulses.     Heart sounds: Normal heart sounds. No murmur heard. Pulmonary:     Effort: Pulmonary effort is normal. Prolonged expiration present. No tachypnea, bradypnea or respiratory distress.     Breath sounds: Decreased air movement present. Examination of the right-upper field reveals wheezing. Examination of the left-upper field reveals wheezing. Examination of the right-middle field reveals wheezing. Examination of the left-middle field reveals wheezing. Examination of the right-lower field reveals wheezing. Examination of the left-lower field reveals wheezing. Wheezing present. No rales.  Chest:     Chest wall: No mass, lacerations, deformity, swelling, tenderness, crepitus or edema.  Abdominal:     General: Bowel sounds are normal. There is no distension.     Palpations: Abdomen is soft.     Tenderness: There is no abdominal tenderness. There is no right CVA tenderness or left CVA tenderness.  Musculoskeletal:        General: No deformity.     Cervical back: Normal range of motion and neck supple. No edema, rigidity or crepitus. No  pain with movement or spinous process tenderness.     Right lower leg: No edema.     Left lower leg: No edema.  Lymphadenopathy:     Cervical: No cervical adenopathy.  Skin:    General: Skin is warm and dry.     Capillary Refill: Capillary refill takes less than 2 seconds.  Neurological:     General: No focal deficit present.     Mental Status: He is alert and oriented to person, place, and time. Mental status is at baseline.  Psychiatric:        Mood and Affect: Mood normal.    ED Results / Procedures / Treatments   Labs (all labs ordered are listed, but only abnormal results  are displayed) Labs Reviewed  CBC WITH DIFFERENTIAL/PLATELET - Abnormal; Notable for the following components:      Result Value   WBC 17.0 (*)    Neutro Abs 13.8 (*)    Monocytes Absolute 1.7 (*)    All other components within normal limits  COMPREHENSIVE METABOLIC PANEL - Abnormal; Notable for the following components:   Glucose, Bld 106 (*)    All other components within normal limits  URINALYSIS, ROUTINE W REFLEX MICROSCOPIC - Abnormal; Notable for the following components:   Color, Urine AMBER (*)    APPearance HAZY (*)    Glucose, UA 50 (*)    Protein, ur 30 (*)    Bacteria, UA RARE (*)    All other components within normal limits  RESP PANEL BY RT-PCR (FLU A&B, COVID) ARPGX2  LIPASE, BLOOD    EKG EKG: NSR, no STEMI.   Radiology DG Chest 2 View  Result Date: 02/15/2021 CLINICAL DATA:  sob EXAM: CHEST - 2 VIEW COMPARISON:  March 30, 2018. FINDINGS: The heart size and mediastinal contours are within normal limits. Both lungs are clear. No visible pleural effusions or pneumothorax. No acute osseous abnormality. IMPRESSION: No active cardiopulmonary disease. Electronically Signed   By: Feliberto Harts M.D.   On: 02/15/2021 10:36    Procedures Procedures   Medications Ordered in ED Medications  ipratropium-albuterol (DUONEB) 0.5-2.5 (3) MG/3ML nebulizer solution 3 mL (3 mLs  Nebulization Given 02/15/21 1134)  predniSONE (DELTASONE) tablet 60 mg (60 mg Oral Given 02/15/21 1134)    ED Course  I have reviewed the triage vital signs and the nursing notes.  Pertinent labs & imaging results that were available during my care of the patient were reviewed by me and considered in my medical decision making (see chart for details).    MDM Rules/Calculators/A&P                         25 year old male with hx of asthma who presents with concern for SOB and chest tightness x 3 days.   DDX includes but is not limited to asthma exacerbation, pneumonia, pleural effusion, PE, viral URI, CHF.   VS normal on intake. Cardiac exam is normal, pulmonary exam is significant for wheezing throughout, decreased air movement throughout. Abdominal exam is benign. Neurovascularly intact in all 4 extremities.   Labs performed from triage. CBC with Leukocytosis of 17, patient did just complete course of prednisone. CMP unremarkable. Lipse is normal. RPP negative. UA unremarkable.   DG chest unremarkable. EKG normal.   IV fluids offered given recent recurrent nausea; patient declined. Duoneb administered, with significant improvement in his wheezing. Oral prednisone given.   Will d/c with inhaler and prednisone burst. Also PRN zofran. No further workup warranted in the ED at this time. His HPI, physical exam, and workup is most consistent with acute asthma exacerbation, likely secondary to acute viral illness.  Sho voiced understanding of his medical evaluation and treatment plan. Each of his questions was answered to his expressed satisfaction. Return precautions were given. Patient is well-appearing, stable, and appropriate for discharge at this time.   This chart was dictated using voice recognition software, Dragon. Despite the best efforts of this provider to proofread and correct errors, errors may still occur which can change documentation meaning.   Final Clinical  Impression(s) / ED Diagnoses Final diagnoses:  Mild intermittent asthma with exacerbation    Rx / DC Orders ED Discharge Orders  Ordered    ipratropium (ATROVENT HFA) 17 MCG/ACT inhaler  Every 6 hours PRN        02/15/21 1326    predniSONE (DELTASONE) 50 MG tablet  Daily with breakfast        02/15/21 1326    ondansetron (ZOFRAN ODT) 4 MG disintegrating tablet  Every 8 hours PRN        02/15/21 1326             Marcas Bowsher, Eugene Gavia, PA-C 02/15/21 [address] Eugene Gavia, PA-C 02/15/21 1858    Alvira Monday, MD 02/17/21 [phone]

## 2021-02-15 NOTE — ED Triage Notes (Signed)
Pt here from uc with reports of with chest pain, heavy breathing, emesis yesterday. Pt used his daughters nebulizer with some relief.

## 2021-02-15 NOTE — Discharge Instructions (Addendum)
You are seen in the ER today for your chest tightness and shortness of breath.  Was found to have an asthma exacerbation.  Your breathing significantly improved after ministration of a breathing treatment.  You are also given a dose of steroid in the emergency department.  You have been discharged home with steroid to take for the next few days as well as an additional inhaler.  You have a prescription for nausea medicine to take as needed as well.  Please follow-up with your primary care doctor or the free clinic listed below ([facility]).  Return to the ER with any new difficulty breathing, chest pain, shortness of breath, palpitations, or any other new severe symptom.

## 2021-03-02 DIAGNOSIS — K219 Gastro-esophageal reflux disease without esophagitis: Secondary | ICD-10-CM | POA: Insufficient documentation

## 2021-03-06 ENCOUNTER — Encounter: Payer: Self-pay | Admitting: Emergency Medicine

## 2021-03-06 ENCOUNTER — Other Ambulatory Visit: Payer: Self-pay

## 2021-03-06 ENCOUNTER — Ambulatory Visit
Admission: EM | Admit: 2021-03-06 | Discharge: 2021-03-06 | Disposition: A | Discharge status: Home / Self Care | Payer: Self-pay | Attending: Internal Medicine | Admitting: Internal Medicine

## 2021-03-06 DIAGNOSIS — W57XXXA Bitten or stung by nonvenomous insect and other nonvenomous arthropods, initial encounter: Secondary | ICD-10-CM

## 2021-03-06 DIAGNOSIS — S00462A Insect bite (nonvenomous) of left ear, initial encounter: Secondary | ICD-10-CM

## 2021-03-06 DIAGNOSIS — R509 Fever, unspecified: Secondary | ICD-10-CM

## 2021-03-06 LAB — POCT INFLUENZA A/B
Influenza A, POC: NEGATIVE
Influenza B, POC: NEGATIVE

## 2021-03-06 MED ORDER — MUPIROCIN 2 % EX OINT: 1.0000 "application " | TOPICAL_OINTMENT | Freq: Two times a day (BID) | CUTANEOUS | 0 refills | Status: AC

## 2021-03-06 NOTE — Discharge Instructions (Signed)
Your rapid flu test was negative.  You have been prescribed antibiotic ointment to apply to left ear.  Please follow-up if symptoms worsen or persist.

## 2021-03-06 NOTE — ED Triage Notes (Signed)
Woke up out of sleep this morning with a fever. Took tylenol. Felt a bump on his external ear. Looks like a pimple.

## 2021-03-06 NOTE — ED Provider Notes (Signed)
[name]    CSN: [id number] Arrival date & time: 03/06/21  0809      History   Chief Complaint Chief Complaint  Patient presents with   Fever    HPI Joshua Greer is a 25 y.o. male.   Patient presents today for a bump to the outside of his left ear that has been present for a few days.  Patient reports that he felt a bug "buzzing at his ear" and then he had a pimple-like lesion afterwards.  Lesion has not drained anything.  Denies any itchiness.  Denies any decreased hearing.   Patient also had a fever this morning upon awakening with temp max of 101.  Patient denies any other symptoms including nasal congestion, cough, nausea, vomiting, diarrhea.  Denies any known sick contacts.  Denies chest pain or shortness of breath.   Fever  Past Medical History:  Diagnosis Date   Anemia    Asthma    History of blood transfusion    Seasonal allergies     Patient Active Problem List   Diagnosis Date Noted   Nasal congestion 12/27/2019    Past Surgical History:  Procedure Laterality Date   HERNIA REPAIR         Home Medications    Prior to Admission medications   Medication Sig Start Date End Date Taking? Authorizing Provider  mupirocin ointment (BACTROBAN) 2 % Apply 1 application topically 2 (two) times daily for 7 days. 03/06/21 03/13/21 Yes Maxum Cassarino, Acie Fredrickson, FNP  albuterol (VENTOLIN HFA) 108 (90 Base) MCG/ACT inhaler Inhale 1-2 puffs into the lungs every 4 (four) hours as needed for wheezing or shortness of breath (cough, shortness of breath or wheezing.). 08/16/20   Bing Neighbors, FNP  azithromycin (ZITHROMAX Z-PAK) 250 MG tablet Take 2 tabs by mouth day one, then one tab daily for remaining 4 days Patient not taking: No sig reported 01/24/21   Tomi Bamberger, PA-C  brompheniramine-pseudoephedrine-DM 30-2-10 MG/5ML syrup Take 5 mLs by mouth 4 (four) times daily as needed. Patient not taking: Reported on 02/15/2021 08/16/20   Bing Neighbors, FNP   cetirizine (ZYRTEC ALLERGY) 10 MG tablet Take 1 tablet (10 mg total) by mouth daily. Patient not taking: Reported on 02/15/2021 01/29/21   Wallis Bamberg, PA-C  fluticasone [facility]) 50 MCG/ACT nasal spray Place 2 sprays into both nostrils daily. 01/29/21   Wallis Bamberg, PA-C  ipratropium (ATROVENT HFA) 17 MCG/ACT inhaler Inhale 2 puffs into the lungs every 6 (six) hours as needed for wheezing. 02/15/21   Sponseller, Lupe Carney R, PA-C  montelukast (SINGULAIR) 10 MG tablet Take 1 tablet (10 mg total) by mouth at bedtime. Patient not taking: No sig reported 08/16/20   Bing Neighbors, FNP  ondansetron (ZOFRAN ODT) 4 MG disintegrating tablet Take 1 tablet (4 mg total) by mouth every 8 (eight) hours as needed for nausea or vomiting. 02/15/21   Sponseller, Lupe Carney R, PA-C  ALBUTEROL IN Inhale into the lungs.  10/26/19  [provider]    Family History Family History  Problem Relation Age of Onset   Healthy Mother    Healthy Father     Social History Social History   Tobacco Use   Smoking status: Never   Smokeless tobacco: Never  Substance Use Topics   Alcohol use: No   Drug use: No     Allergies   Patient has no known allergies.   Review of Systems Review of Systems Per HPI  Physical Exam Triage Vital  Signs ED Triage Vitals [03/06/21 0822]  Enc Vitals Group     BP 125/72     Pulse Rate 71     Resp 16     Temp 98.3 F (36.8 C)     Temp Source Oral     SpO2 97 %     Weight      Height      Head Circumference      Peak Flow      Pain Score 0     Pain Loc      Pain Edu?      Excl. in GC?    No data found.  Updated Vital Signs BP 125/72 (BP Location: Left Arm)   Pulse 71   Temp 98.3 F (36.8 C) (Oral)   Resp 16   SpO2 97%   Visual Acuity Right Eye Distance:   Left Eye Distance:   Bilateral Distance:    Right Eye Near:   Left Eye Near:    Bilateral Near:     Physical Exam Constitutional:      General: He is not in acute distress.    Appearance:  Normal appearance. He is not toxic-appearing or diaphoretic.  HENT:     Head: Normocephalic and atraumatic.     Right Ear: Tympanic membrane, ear canal and external ear normal. No decreased hearing noted. No swelling or tenderness. No foreign body. No mastoid tenderness. Tympanic membrane is not perforated, erythematous, retracted or bulging.     Left Ear: Tympanic membrane, ear canal and external ear normal. No decreased hearing noted. No swelling or tenderness. No foreign body. No mastoid tenderness. Tympanic membrane is not perforated, erythematous, retracted or bulging.     Ears:     Comments: External ear appears completely normal.  Location of where patient states that there was a pimple-like lesion and pain is completely normal.  No erythema, lacerations, abrasions, insect bite.    Nose: Rhinorrhea present. Rhinorrhea is clear.     Mouth/Throat:     Lips: Pink.     Mouth: Mucous membranes are moist.     Pharynx: No posterior oropharyngeal erythema.  Eyes:     Extraocular Movements: Extraocular movements intact.     Conjunctiva/sclera: Conjunctivae normal.  Cardiovascular:     Rate and Rhythm: Normal rate and regular rhythm.     Pulses: Normal pulses.     Heart sounds: Normal heart sounds.  Pulmonary:     Effort: Pulmonary effort is normal. No respiratory distress.     Breath sounds: Normal breath sounds. No wheezing.  Abdominal:     General: Bowel sounds are normal. There is no distension.     Palpations: Abdomen is soft.     Tenderness: There is no abdominal tenderness.  Skin:    General: Skin is warm and dry.  Neurological:     General: No focal deficit present.     Mental Status: He is alert and oriented to person, place, and time. Mental status is at baseline.  Psychiatric:        Mood and Affect: Mood normal.        Behavior: Behavior normal.        Thought Content: Thought content normal.        Judgment: Judgment normal.     UC Treatments / Results  Labs (all  labs ordered are listed, but only abnormal results are displayed) Labs Reviewed  POCT INFLUENZA A/B    EKG   Radiology No results found.  Procedures Procedures (including critical care time)  Medications Ordered in UC Medications - No data to display  Initial Impression / Assessment and Plan / UC Course  I have reviewed the triage vital signs and the nursing notes.  Pertinent labs & imaging results that were available during my care of the patient were reviewed by me and considered in my medical decision making (see chart for details).     It appears the patient may have the start of viral upper respiratory infection that may be causing fever due to rhinorrhea noted on physical exam.  Rapid flu test was negative.  Patient was offered COVID-19 testing but declined.  Discussed supportive care and symptomatic management with patient.  Ear appears completely benign on exam.  Will prescribe mupirocin ointment to apply to affected area to prevent and/or treat infection.  Patient advised to monitor area closely and to follow-up if symptoms persist.  Discussed strict return precautions.  Patient verbalized understanding and was agreeable with plan. Final Clinical Impressions(s) / UC Diagnoses   Final diagnoses:  Fever, unspecified  Insect bite of left ear, initial encounter     Discharge Instructions      Your rapid flu test was negative.  You have been prescribed antibiotic ointment to apply to left ear.  Please follow-up if symptoms worsen or persist.     ED Prescriptions     Medication Sig Dispense Auth. Provider   mupirocin ointment (BACTROBAN) 2 % Apply 1 application topically 2 (two) times daily for 7 days. 22 g Gustavus Bryant, [place]      PDMP not reviewed this encounter.   Gustavus Bryant, [place] 03/06/21 [phone]

## 2021-05-03 ENCOUNTER — Ambulatory Visit
Admission: EM | Admit: 2021-05-03 | Discharge: 2021-05-03 | Disposition: A | Discharge status: Home / Self Care | Payer: 59 | Attending: Physician Assistant | Admitting: Physician Assistant

## 2021-05-03 ENCOUNTER — Other Ambulatory Visit: Payer: Self-pay

## 2021-05-03 ENCOUNTER — Encounter: Payer: Self-pay | Admitting: Physician Assistant

## 2021-05-03 DIAGNOSIS — J069 Acute upper respiratory infection, unspecified: Secondary | ICD-10-CM

## 2021-05-03 MED ORDER — OSELTAMIVIR PHOSPHATE 75 MG PO CAPS: 75.0000 mg | ORAL_CAPSULE | Freq: Two times a day (BID) | ORAL | 0 refills | Status: DC

## 2021-05-03 NOTE — ED Triage Notes (Signed)
Mother was dx with flu. Patient has had body aches, chills, runny nose, cough x 2 days.

## 2021-05-03 NOTE — ED Provider Notes (Signed)
[name]    CSN: [id number] Arrival date & time: 05/03/21  0803      History   Chief Complaint Chief Complaint  Patient presents with   Generalized Body Aches    HPI Joshua Greer is a 25 y.o. male.   Patient here today for evaluation of nasal congestion, body aches, cough and chills that started 2 days ago. He states his mother was recently diagnosed with influenza and daughter currently has an ear infection. He has not tried any treatment for symptoms.  The history is provided by the patient.   Past Medical History:  Diagnosis Date   Anemia    Asthma    History of blood transfusion    Seasonal allergies     Patient Active Problem List   Diagnosis Date Noted   Nasal congestion 12/27/2019    Past Surgical History:  Procedure Laterality Date   HERNIA REPAIR         Home Medications    Prior to Admission medications   Medication Sig Start Date End Date Taking? Authorizing Provider  oseltamivir (TAMIFLU) 75 MG capsule Take 1 capsule (75 mg total) by mouth every 12 (twelve) hours. 05/03/21  Yes Tomi Bamberger, PA-C  albuterol (VENTOLIN HFA) 108 (90 Base) MCG/ACT inhaler Inhale 1-2 puffs into the lungs every 4 (four) hours as needed for wheezing or shortness of breath (cough, shortness of breath or wheezing.). 08/16/20   Bing Neighbors, FNP  azithromycin (ZITHROMAX Z-PAK) 250 MG tablet Take 2 tabs by mouth day one, then one tab daily for remaining 4 days Patient not taking: No sig reported 01/24/21   Tomi Bamberger, PA-C  brompheniramine-pseudoephedrine-DM 30-2-10 MG/5ML syrup Take 5 mLs by mouth 4 (four) times daily as needed. Patient not taking: Reported on 02/15/2021 08/16/20   Bing Neighbors, FNP  cetirizine (ZYRTEC ALLERGY) 10 MG tablet Take 1 tablet (10 mg total) by mouth daily. Patient not taking: Reported on 02/15/2021 01/29/21   Wallis Bamberg, PA-C  fluticasone [facility]) 50 MCG/ACT nasal spray Place 2 sprays into both nostrils daily.  01/29/21   Wallis Bamberg, PA-C  ipratropium (ATROVENT HFA) 17 MCG/ACT inhaler Inhale 2 puffs into the lungs every 6 (six) hours as needed for wheezing. 02/15/21   Sponseller, Lupe Carney R, PA-C  montelukast (SINGULAIR) 10 MG tablet Take 1 tablet (10 mg total) by mouth at bedtime. Patient not taking: No sig reported 08/16/20   Bing Neighbors, FNP  ondansetron (ZOFRAN ODT) 4 MG disintegrating tablet Take 1 tablet (4 mg total) by mouth every 8 (eight) hours as needed for nausea or vomiting. 02/15/21   Sponseller, Lupe Carney R, PA-C  ALBUTEROL IN Inhale into the lungs.  10/26/19  [provider]    Family History Family History  Problem Relation Age of Onset   Healthy Mother    Healthy Father     Social History Social History   Tobacco Use   Smoking status: Never   Smokeless tobacco: Never  Substance Use Topics   Alcohol use: No   Drug use: No     Allergies   Patient has no known allergies.   Review of Systems Review of Systems  Constitutional:  Positive for chills. Negative for fever.  HENT:  Positive for congestion and sore throat (mild). Negative for ear pain.   Eyes:  Negative for discharge and redness.  Respiratory:  Positive for cough. Negative for shortness of breath.   Gastrointestinal:  Negative for abdominal pain, diarrhea, nausea and vomiting.  Musculoskeletal:  Positive for myalgias.    Physical Exam Triage Vital Signs ED Triage Vitals  Enc Vitals Group     BP      Pulse      Resp      Temp      Temp src      SpO2      Weight      Height      Head Circumference      Peak Flow      Pain Score      Pain Loc      Pain Edu?      Excl. in GC?    No data found.  Updated Vital Signs BP 113/70 (BP Location: Left Arm)    Pulse 78    Temp 98.7 F (37.1 C) (Oral)    Resp 16    SpO2 97%   Physical Exam Vitals and nursing note reviewed.  Constitutional:      General: He is not in acute distress.    Appearance: Normal appearance. He is not  ill-appearing.  HENT:     Head: Normocephalic and atraumatic.     Right Ear: Tympanic membrane normal.     Left Ear: Tympanic membrane normal.     Nose: Congestion present.     Mouth/Throat:     Mouth: Mucous membranes are moist.     Pharynx: Oropharynx is clear. No oropharyngeal exudate or posterior oropharyngeal erythema.  Eyes:     Conjunctiva/sclera: Conjunctivae normal.  Cardiovascular:     Rate and Rhythm: Normal rate and regular rhythm.     Heart sounds: Normal heart sounds. No murmur heard. Pulmonary:     Effort: Pulmonary effort is normal. No respiratory distress.     Breath sounds: Normal breath sounds. No wheezing, rhonchi or rales.  Skin:    General: Skin is warm and dry.  Neurological:     Mental Status: He is alert.  Psychiatric:        Mood and Affect: Mood normal.        Thought Content: Thought content normal.     UC Treatments / Results  Labs (all labs ordered are listed, but only abnormal results are displayed) Labs Reviewed  COVID-19, FLU A+B NAA    EKG   Radiology No results found.  Procedures Procedures (including critical care time)  Medications Ordered in UC Medications - No data to display  Initial Impression / Assessment and Plan / UC Course  I have reviewed the triage vital signs and the nursing notes.  Pertinent labs & imaging results that were available during my care of the patient were reviewed by me and considered in my medical decision making (see chart for details).    Flu and covid screening ordered. Will treat with tamiflu given known exposure. Encouraged symptomatic treatment otherwise.  Recommended follow up if symptoms worsen or with any further concerns.   Final Clinical Impressions(s) / UC Diagnoses   Final diagnoses:  Acute upper respiratory infection   Discharge Instructions   None    ED Prescriptions     Medication Sig Dispense Auth. Provider   oseltamivir (TAMIFLU) 75 MG capsule Take 1 capsule (75 mg total)  by mouth every 12 (twelve) hours. 10 capsule Tomi Bamberger, PA-C      PDMP not reviewed this encounter.   Tomi Bamberger, PA-C 05/03/21 [phone]

## 2021-05-04 LAB — COVID-19, FLU A+B NAA
Influenza A, NAA: NOT DETECTED
Influenza B, NAA: NOT DETECTED
SARS-CoV-2, NAA: NOT DETECTED

## 2021-05-28 ENCOUNTER — Other Ambulatory Visit: Payer: Self-pay

## 2021-05-28 ENCOUNTER — Ambulatory Visit
Admission: EM | Admit: 2021-05-28 | Discharge: 2021-05-28 | Disposition: A | Discharge status: Home / Self Care | Payer: 59 | Attending: Internal Medicine | Admitting: Internal Medicine

## 2021-05-28 ENCOUNTER — Encounter: Payer: Self-pay | Admitting: Emergency Medicine

## 2021-05-28 ENCOUNTER — Emergency Department (HOSPITAL_COMMUNITY): Payer: 59

## 2021-05-28 ENCOUNTER — Emergency Department (HOSPITAL_COMMUNITY)
Admission: EM | Admit: 2021-05-28 | Discharge: 2021-05-28 | Disposition: A | Discharge status: Home / Self Care | Payer: 59 | Attending: Emergency Medicine | Admitting: Emergency Medicine

## 2021-05-28 DIAGNOSIS — Z20822 Contact with and (suspected) exposure to covid-19: Secondary | ICD-10-CM | POA: Insufficient documentation

## 2021-05-28 DIAGNOSIS — D72819 Decreased white blood cell count, unspecified: Secondary | ICD-10-CM | POA: Diagnosis not present

## 2021-05-28 DIAGNOSIS — R42 Dizziness and giddiness: Secondary | ICD-10-CM

## 2021-05-28 DIAGNOSIS — R0789 Other chest pain: Secondary | ICD-10-CM | POA: Diagnosis present

## 2021-05-28 DIAGNOSIS — R0782 Intercostal pain: Secondary | ICD-10-CM | POA: Diagnosis not present

## 2021-05-28 LAB — CBC WITH DIFFERENTIAL/PLATELET
Abs Immature Granulocytes: 0.01 10*3/uL (ref 0.00–0.07)
Basophils Absolute: 0.1 10*3/uL (ref 0.0–0.1)
Basophils Relative: 1 %
Eosinophils Absolute: 0.1 10*3/uL (ref 0.0–0.5)
Eosinophils Relative: 2 %
HCT: 44.9 % (ref 39.0–52.0)
Hemoglobin: 15.4 g/dL (ref 13.0–17.0)
Immature Granulocytes: 0 %
Lymphocytes Relative: 44 %
Lymphs Abs: 1.6 10*3/uL (ref 0.7–4.0)
MCH: 30.1 pg (ref 26.0–34.0)
MCHC: 34.3 g/dL (ref 30.0–36.0)
MCV: 87.7 fL (ref 80.0–100.0)
Monocytes Absolute: 0.4 10*3/uL (ref 0.1–1.0)
Monocytes Relative: 11 %
Neutro Abs: 1.5 10*3/uL — ABNORMAL LOW (ref 1.7–7.7)
Neutrophils Relative %: 42 %
Platelets: 169 10*3/uL (ref 150–400)
RBC: 5.12 MIL/uL (ref 4.22–5.81)
RDW: 12.9 % (ref 11.5–15.5)
WBC: 3.7 10*3/uL — ABNORMAL LOW (ref 4.0–10.5)
nRBC: 0 % (ref 0.0–0.2)

## 2021-05-28 LAB — RESP PANEL BY RT-PCR (FLU A&B, COVID) ARPGX2
Influenza A by PCR: NEGATIVE
Influenza B by PCR: NEGATIVE
SARS Coronavirus 2 by RT PCR: NEGATIVE

## 2021-05-28 LAB — COMPREHENSIVE METABOLIC PANEL
ALT: 13 U/L (ref 0–44)
AST: 18 U/L (ref 15–41)
Albumin: 4.3 g/dL (ref 3.5–5.0)
Alkaline Phosphatase: 47 U/L (ref 38–126)
Anion gap: 10 (ref 5–15)
BUN: 13 mg/dL (ref 6–20)
CO2: 23 mmol/L (ref 22–32)
Calcium: 9.4 mg/dL (ref 8.9–10.3)
Chloride: 105 mmol/L (ref 98–111)
Creatinine, Ser: 0.94 mg/dL (ref 0.61–1.24)
GFR, Estimated: >60 mL/min (ref >60)
Glucose, Bld: 99 mg/dL (ref 70–99)
Potassium: 3.8 mmol/L (ref 3.5–5.1)
Sodium: 138 mmol/L (ref 135–145)
Total Bilirubin: 0.8 mg/dL (ref 0.3–1.2)
Total Protein: 7 g/dL (ref 6.5–8.1)

## 2021-05-28 LAB — URINALYSIS, ROUTINE W REFLEX MICROSCOPIC
Bacteria, UA: NONE SEEN
Bilirubin Urine: NEGATIVE
Glucose, UA: NEGATIVE mg/dL
Ketones, ur: 20 mg/dL — AB
Leukocytes,Ua: NEGATIVE
Nitrite: NEGATIVE
Protein, ur: NEGATIVE mg/dL
Specific Gravity, Urine: 1.028 (ref 1.005–1.030)
pH: 6 (ref 5.0–8.0)

## 2021-05-28 LAB — TROPONIN I (HIGH SENSITIVITY)
Troponin I (High Sensitivity): <2 ng/L (ref <18)
Troponin I (High Sensitivity): <2 ng/L (ref <18)

## 2021-05-28 LAB — POCT FASTING CBG KUC MANUAL ENTRY: POCT Glucose (KUC): 113 mg/dL — AB (ref 70–99)

## 2021-05-28 LAB — LIPASE, BLOOD: Lipase: 24 U/L (ref 11–51)

## 2021-05-28 MED ORDER — MECLIZINE HCL 25 MG PO TABS
25.0000 mg | ORAL_TABLET | Freq: Once | ORAL | Status: AC
  Administered 2021-05-28: 25 mg via ORAL
  Filled 2021-05-28: qty 1

## 2021-05-28 MED ORDER — SODIUM CHLORIDE 0.9 % IV BOLUS: 1000.0000 mL | Freq: Once | INTRAVENOUS | Status: DC

## 2021-05-28 NOTE — ED Triage Notes (Signed)
Pt sent here from UC for eval of intermittent dizziness that started last night and still persists, although seems to be improving. Also developed chest pain in left upper chest that has been intermittent.

## 2021-05-28 NOTE — Discharge Instructions (Signed)
Please go to the hospital as soon as you leave urgent care for further evaluation and management. 

## 2021-05-28 NOTE — ED Triage Notes (Signed)
Felt dizzy while driving last night. Now continues with mild dizziness. Denies N/V/D, pain, blood sugar. Recently had URI, and said he did feel his ears pop yesterday.

## 2021-05-28 NOTE — ED Provider Triage Note (Signed)
Emergency Medicine Provider Triage Evaluation Note  Joshua Greer , a 26 y.o. male  was evaluated in triage.  Pt complains of multiple complaints.  He reports that he had dizziness starting last night.  Reports nasal congestion for the past 4-5 days.  His vision is slightly blurry since last night.  No weakness. He reports intermittent left sided chest pain.   He states his mom told him to ask about his heart because he was told that hay fever that he had in 2005 could cause heart issues.   Review of Systems  Positive: See above Negative:   Physical Exam  BP 131/76    Pulse 63    Temp 98.6 F (37 C) (Oral)    Resp 16    SpO2 98%  Gen:   Awake, no distress   Resp:  Normal effort  MSK:   Moves extremities without difficulty  Other:  Normal speech.   Medical Decision Making  Medically screening exam initiated at 9:30 AM.  Appropriate orders placed.  Joshua Greer was informed that the remainder of the evaluation will be completed by another provider, this initial triage assessment does not replace that evaluation, and the importance of remaining in the ED until their evaluation is complete.  Note: Portions of this report may have been transcribed using voice recognition software. Every effort was made to ensure accuracy; however, inadvertent computerized transcription errors may be present    Cristina Gong, PA-C 05/28/21 0935

## 2021-05-28 NOTE — ED Provider Notes (Signed)
[name]    CSN: [id number] Arrival date & time: 05/28/21  0801      History   Chief Complaint Chief Complaint  Patient presents with   Dizziness    HPI Joshua Greer is a 26 y.o. male.   Patient presents with dizziness that started last night.  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  Dizziness has now improved but it is still present.  He also reports that he has had associated blurry vision with a "mild headache".  Patient also reports that he has had some nasal congestion for approximately 4 to 5 days.  He is attributing this to his allergies as he has chronic history of allergies.  He takes allergy medication as well as Flonase.  Denies any known fevers or sick contacts.  Patient also denies chest pain, shortness of breath, nausea, vomiting, diarrhea, abdominal pain.  Having regular bowel movements.  Denies any blood in stool.  Denies urinary burning or urinary frequency. Denies history of dizziness.   Dizziness  Past Medical History:  Diagnosis Date   Anemia    Asthma    History of blood transfusion    Seasonal allergies     Patient Active Problem List   Diagnosis Date Noted   Nasal congestion 12/27/2019    Past Surgical History:  Procedure Laterality Date   HERNIA REPAIR         Home Medications    Prior to Admission medications   Medication Sig Start Date End Date Taking? Authorizing Provider  albuterol (VENTOLIN HFA) 108 (90 Base) MCG/ACT inhaler Inhale 1-2 puffs into the lungs every 4 (four) hours as needed for wheezing or shortness of breath (cough, shortness of breath or wheezing.). 08/16/20   Scot Jun, FNP  azithromycin (ZITHROMAX Z-PAK) 250 MG tablet Take 2 tabs by mouth day one, then one tab daily for remaining 4 days Patient not taking: No sig reported 01/24/21   Francene Finders, PA-C  brompheniramine-pseudoephedrine-DM 30-2-10 MG/5ML syrup Take 5 mLs by mouth 4 (four)  times daily as needed. Patient not taking: Reported on 02/15/2021 08/16/20   Scot Jun, FNP  cetirizine (ZYRTEC ALLERGY) 10 MG tablet Take 1 tablet (10 mg total) by mouth daily. Patient not taking: Reported on 02/15/2021 01/29/21   Jaynee Eagles, PA-C  fluticasone [facility]) 50 MCG/ACT nasal spray Place 2 sprays into both nostrils daily. 01/29/21   Jaynee Eagles, PA-C  ipratropium (ATROVENT HFA) 17 MCG/ACT inhaler Inhale 2 puffs into the lungs every 6 (six) hours as needed for wheezing. 02/15/21   Sponseller, Eugene Garnet R, PA-C  montelukast (SINGULAIR) 10 MG tablet Take 1 tablet (10 mg total) by mouth at bedtime. Patient not taking: No sig reported 08/16/20   Scot Jun, FNP  ondansetron (ZOFRAN ODT) 4 MG disintegrating tablet Take 1 tablet (4 mg total) by mouth every 8 (eight) hours as needed for nausea or vomiting. 02/15/21   Sponseller, Gypsy Balsam, PA-C  oseltamivir (TAMIFLU) 75 MG capsule Take 1 capsule (75 mg total) by mouth every 12 (twelve) hours. 05/03/21   Francene Finders, PA-C  ALBUTEROL IN Inhale into the lungs.  10/26/19  [provider]    Family History Family History  Problem Relation Age of Onset   Healthy Mother    Healthy Father     Social History Social History   Tobacco Use   Smoking status: Never   Smokeless tobacco: Never  Substance  Use Topics   Alcohol use: No   Drug use: No     Allergies   Patient has no known allergies.   Review of Systems Review of Systems Per HPI  Physical Exam Triage Vital Signs ED Triage Vitals [05/28/21 0813]  Enc Vitals Group     BP 132/76     Pulse Rate (!) 59     Resp 16     Temp 98.3 F (36.8 C)     Temp Source Oral     SpO2 95 %     Weight      Height      Head Circumference      Peak Flow      Pain Score 0     Pain Loc      Pain Edu?      Excl. in GC?    No data found.  Updated Vital Signs BP 132/76 (BP Location: Left Arm)    Pulse 79    Temp 98.3 F (36.8 C) (Oral)    Resp 16    SpO2 95%    Visual Acuity Right Eye Distance:   Left Eye Distance:   Bilateral Distance:    Right Eye Near:   Left Eye Near:    Bilateral Near:     Physical Exam Constitutional:      General: He is not in acute distress.    Appearance: Normal appearance. He is not toxic-appearing or diaphoretic.  HENT:     Head: Normocephalic and atraumatic.     Right Ear: Tympanic membrane and ear canal normal.     Left Ear: Tympanic membrane and ear canal normal.     Nose: Congestion present.     Mouth/Throat:     Mouth: Mucous membranes are moist.     Pharynx: No posterior oropharyngeal erythema.  Eyes:     Extraocular Movements: Extraocular movements intact.     Conjunctiva/sclera: Conjunctivae normal.     Pupils: Pupils are equal, round, and reactive to light.  Cardiovascular:     Rate and Rhythm: Normal rate and regular rhythm.     Pulses: Normal pulses.     Heart sounds: Normal heart sounds.  Pulmonary:     Effort: Pulmonary effort is normal. No respiratory distress.     Breath sounds: Normal breath sounds.  Abdominal:     General: Abdomen is flat. Bowel sounds are normal. There is no distension.     Palpations: Abdomen is soft.     Tenderness: There is no abdominal tenderness.  Skin:    General: Skin is warm and dry.  Neurological:     General: No focal deficit present.     Mental Status: He is alert and oriented to person, place, and time. Mental status is at baseline.  Psychiatric:        Mood and Affect: Mood normal.        Behavior: Behavior normal.        Thought Content: Thought content normal.        Judgment: Judgment normal.     UC Treatments / Results  Labs (all labs ordered are listed, but only abnormal results are displayed) Labs Reviewed  POCT FASTING CBG [facility] MANUAL ENTRY - Abnormal; Notable for the following components:      Result Value   POCT Glucose ([facility]) 113 (*)    All other components within normal limits  NOVEL CORONAVIRUS, NAA  CBC  COMPREHENSIVE  METABOLIC PANEL    EKG  Radiology No results found.  Procedures Procedures (including critical care time)  Medications Ordered in UC Medications - No data to display  Initial Impression / Assessment and Plan / UC Course  I have reviewed the triage vital signs and the nursing notes.  Pertinent labs & imaging results that were available during my care of the patient were reviewed by me and considered in my medical decision making (see chart for details).     Patient's heart rate was fluctuating throughout physical exam in triage.  Heart rates ranged from 40s to 70s.  EKG showing sinus rhythm with sinus arrhythmia with a ventricular rate in the 40s.  This requires more extensive evaluation than can be provided in the urgent care to rule out more worrisome etiologies related to dizziness.  Patient was advised to go to the hospital for further evaluation and management.  Patient was agreeable with plan.  Vital signs stable at discharge.  Agree with patient self transport to the hospital. CMP, CBC, and covid 19 test completed at same time of EKG by nursing staff as all orders were placed at the same time. Upon further interpretation of the EKG, it was explained to the patient that these tests may be completed at the hospital and he can choose to bypass these tests since he is being sent to the hospital. Although, patient wished to have these tests completed.  Final Clinical Impressions(s) / UC Diagnoses   Final diagnoses:  Dizziness and giddiness     Discharge Instructions      Please go to the hospital as soon as you leave urgent care for further evaluation and management.     ED Prescriptions   None    PDMP not reviewed this encounter.   Teodora Medici, [place] 05/28/21 [phone]

## 2021-05-28 NOTE — Discharge Instructions (Addendum)
Return to ED with any new or worsening signs or symptoms such as shortness of breath, loss of consciousness, excessive dizziness Please follow-up with a cardiologist I referred you to. Please make all attempts to rest today and give your body time to recover Please continue hydrating yourself

## 2021-05-28 NOTE — ED Provider Notes (Signed)
[facility] Provider Note   CSN: [id number] Arrival date & time: 05/28/21  [id number]     History  Chief Complaint  Patient presents with   Chest Pain   Dizziness    Joshua Greer is a 26 y.o. male with no medical history.  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  Patient states that the chest pain is located on the left side of his chest and it comes and goes.  Patient describes the chest pain as "pressure, someone poking me".  Patient states that the chest pain is worsened while standing and relieved when sitting down.  Patient states that he also just "feels off balance".  Patient reports that he has been eating and drinking normally. Patient states he has been stooling and voiding normally. Patient states that he feels like the "room is spinning".  Patient also adds that he had hayfever as a child and is concerned about his heart.  Patient endorses chest pain, dizziness.  Patient denies shortness of breath, nausea, vomiting, headache, weakness. Patient also requesting work note.    Chest Pain Associated symptoms: dizziness   Associated symptoms: no abdominal pain, no fever, no headache, no nausea, no numbness, no shortness of breath, no vomiting and no weakness   Dizziness Associated symptoms: chest pain   Associated symptoms: no diarrhea, no headaches, no nausea, no shortness of breath, no vomiting and no weakness       Home Medications Prior to Admission medications   Medication Sig Start Date End Date Taking? Authorizing Provider  albuterol (VENTOLIN HFA) 108 (90 Base) MCG/ACT inhaler Inhale 1-2 puffs into the lungs every 4 (four) hours as needed for wheezing or shortness of breath (cough, shortness of breath or wheezing.). 08/16/20   Bing NeighborsHarris, Kimberly S, FNP  azithromycin (ZITHROMAX Z-PAK) 250 MG tablet Take 2 tabs by mouth day one, then one tab daily for remaining 4  days Patient not taking: No sig reported 01/24/21   Tomi BambergerMyers, Rebecca F, PA-C  brompheniramine-pseudoephedrine-DM 30-2-10 MG/5ML syrup Take 5 mLs by mouth 4 (four) times daily as needed. Patient not taking: Reported on 02/15/2021 08/16/20   Bing NeighborsHarris, Kimberly S, FNP  cetirizine (ZYRTEC ALLERGY) 10 MG tablet Take 1 tablet (10 mg total) by mouth daily. Patient not taking: Reported on 02/15/2021 01/29/21   Wallis BambergMani, Mario, PA-C  fluticasone [facility](FLONASE) 50 MCG/ACT nasal spray Place 2 sprays into both nostrils daily. 01/29/21   Wallis BambergMani, Mario, PA-C  ipratropium (ATROVENT HFA) 17 MCG/ACT inhaler Inhale 2 puffs into the lungs every 6 (six) hours as needed for wheezing. 02/15/21   Sponseller, Lupe Carneyebekah R, PA-C  montelukast (SINGULAIR) 10 MG tablet Take 1 tablet (10 mg total) by mouth at bedtime. Patient not taking: No sig reported 08/16/20   Bing NeighborsHarris, Kimberly S, FNP  ondansetron (ZOFRAN ODT) 4 MG disintegrating tablet Take 1 tablet (4 mg total) by mouth every 8 (eight) hours as needed for nausea or vomiting. 02/15/21   Sponseller, Eugene Gaviaebekah R, PA-C  oseltamivir (TAMIFLU) 75 MG capsule Take 1 capsule (75 mg total) by mouth every 12 (twelve) hours. 05/03/21   Tomi BambergerMyers, Rebecca F, PA-C  ALBUTEROL IN Inhale into the lungs.  10/26/19  [provider]      Allergies    Patient has no known allergies.    Review of Systems   Review of Systems  Constitutional:  Negative for chills and fever.  HENT:  Positive  for congestion.   Respiratory:  Negative for shortness of breath.   Cardiovascular:  Positive for chest pain.  Gastrointestinal:  Negative for abdominal pain, diarrhea, nausea and vomiting.  Neurological:  Positive for dizziness. Negative for syncope, weakness, numbness and headaches.  All other systems reviewed and are negative.  Physical Exam Updated Vital Signs BP 115/82 (BP Location: Right Arm)    Pulse 70    Temp 97.9 F (36.6 C) (Oral)    Resp 16    SpO2 96%  Physical Exam Vitals and nursing note reviewed.   Constitutional:      General: He is not in acute distress.    Appearance: He is not ill-appearing, toxic-appearing or diaphoretic.  HENT:     Head: Normocephalic and atraumatic.  Eyes:     Extraocular Movements: Extraocular movements intact.     Pupils: Pupils are equal, round, and reactive to light.     Comments: Patient describes worsening of dizziness when attempting to assess extraocular movements.  Nystagmus is noted on exam.  Neck:     Thyroid: No thyromegaly.     Vascular: No JVD.  Cardiovascular:     Rate and Rhythm: Normal rate and regular rhythm.     Pulses:          Radial pulses are 2+ on the right side and 2+ on the left side.       Dorsalis pedis pulses are 2+ on the right side and 2+ on the left side.     Heart sounds: Normal heart sounds.  Pulmonary:     Effort: Pulmonary effort is normal.     Breath sounds: Normal breath sounds. No wheezing, rhonchi or rales.  Abdominal:     Palpations: Abdomen is soft. There is no hepatomegaly or mass.     Tenderness: There is no abdominal tenderness.  Musculoskeletal:     Cervical back: Normal range of motion and neck supple.  Skin:    General: Skin is warm and dry.     Capillary Refill: Capillary refill takes less than 2 seconds.  Neurological:     General: No focal deficit present.     Mental Status: He is alert.     GCS: GCS eye subscore is 4. GCS verbal subscore is 5. GCS motor subscore is 6.     Cranial Nerves: Cranial nerves 2-12 are intact.     Sensory: Sensation is intact.     Motor: Motor function is intact. No weakness or pronator drift.     Coordination: Coordination normal. Finger-Nose-Finger Test and Heel to QuinlanShin Test normal.     Gait: Gait is intact.    ED Results / Procedures / Treatments   Labs (all labs ordered are listed, but only abnormal results are displayed) Labs Reviewed  CBC WITH DIFFERENTIAL/PLATELET - Abnormal; Notable for the following components:      Result Value   WBC 3.7 (*)    Neutro  Abs 1.5 (*)    All other components within normal limits  URINALYSIS, ROUTINE W REFLEX MICROSCOPIC - Abnormal; Notable for the following components:   Color, Urine AMBER (*)    Hgb urine dipstick SMALL (*)    Ketones, ur 20 (*)    All other components within normal limits  RESP PANEL BY RT-PCR (FLU A&B, COVID) ARPGX2  COMPREHENSIVE METABOLIC PANEL  LIPASE, BLOOD  TROPONIN I (HIGH SENSITIVITY)  TROPONIN I (HIGH SENSITIVITY)    EKG EKG Interpretation  Date/Time:  Monday May 28 2021 09:25:34 EST  Ventricular Rate:  67 PR Interval:  140 QRS Duration: 86 QT Interval:  398 QTC Calculation: 420 R Axis:   93 Text Interpretation: Normal sinus rhythm Rightward axis Nonspecific ST abnormality Abnormal ECG When compared with ECG of 28-May-2021 08:37, PREVIOUS ECG IS PRESENT Confirmed by Gerhard Munch [phone]) on 05/28/2021 3:51:12 PM  Radiology DG Chest 2 View  Result Date: 05/28/2021 CLINICAL DATA:  Left chest pain and headache. EXAM: CHEST - 2 VIEW COMPARISON:  02/15/2021 FINDINGS: The lungs appear clear. Cardiac and mediastinal contours normal. No pleural effusion identified. No significant skeletal abnormality. IMPRESSION: No active cardiopulmonary disease. Electronically Signed   By: Gaylyn Rong M.D.   On: 05/28/2021 10:06    Procedures Procedures    Medications Ordered in ED Medications  meclizine (ANTIVERT) tablet 25 mg (has no administration in time range)    ED Course/ Medical Decision Making/ A&P                           Medical Decision Making Amount and/or Complexity of Data Reviewed Radiology: ordered.   26 year old male presents due to dizziness and chest pain.  Patient was seen at urgent care earlier today and sent here for further evaluation work-up.  On examination, patient is nonhypoxic, nontachycardic, speaking full sentences, no lower extremity swelling, radial and DP pulse 2+, no JVD, regular rate rhythm of heart.  Patient neurological examination  shows no focal deficits.  Patient worked up utilizing the following labs that were personally ordered and interpreted by me: Troponin, lipase, CMP, CBC, UA, respiratory panel, chest x-ray, EKG.  Patient troponin less than 2 Lipase 24 CBC shows leukopenia and neutropenia.  White blood cell count of 3.7, neutrophils to 1.5 CMP within normal limits UA shows ketones in urine, small amount hemoglobin Respiratory panel negative for all Chest x-ray shows no acute cardiopulmonary disease. EKG shows sinus rhythm Patient heart score 0   Dix-Hallpike maneuver was performed and patient had positive result on the left side.  Patient stated that after Dix-Hallpike maneuver was performed his dizziness was worsened.  Patient was treated with 25 mg of meclizine at this time. Patient reported that he had improvement of dizziness with dose of Meclizine.  Patient was also noted to have ketones in his urine on urinalysis, I attempted to treat this utilizing 1 L fluid.  At this time, the patient stated he was "ready to go".  He states that he was waiting for an excessive amount of time in the waiting room earlier and was just "ready to go home".  I attempted to encourage patient to stay, however he stated that he would rather just go and follow-up with cardiology.  He also requested a work note.  I discussed the patient and his case with Dr. Jeraldine Loots who feels that the patient is stable to go home at this time.  The patient is requesting discharge.  I have gone over return precautions with this patient and he voices understanding.  I have answered all the questions to the satisfaction of the patient.  The patient stated that he would not wait in order for [place] to assess his response with meclizine treatment.  The patient is safe for discharge at this time   Final Clinical Impression(s) / ED Diagnoses Final diagnoses:  Dizziness  Intercostal pain    Rx / DC Orders ED Discharge Orders     None          Al Decant,  PA-C 05/28/21 1619    Gerhard Munch, MD 05/28/21 1956

## 2021-05-29 LAB — COMPREHENSIVE METABOLIC PANEL
ALT: 10 IU/L (ref 0–44)
AST: 17 IU/L (ref 0–40)
Albumin/Globulin Ratio: 2.5 — ABNORMAL HIGH (ref 1.2–2.2)
Albumin: 4.9 g/dL (ref 4.1–5.2)
Alkaline Phosphatase: 61 IU/L (ref 44–121)
BUN/Creatinine Ratio: 13 (ref 9–20)
BUN: 12 mg/dL (ref 6–20)
Bilirubin Total: 0.5 mg/dL (ref 0.0–1.2)
CO2: 22 mmol/L (ref 20–29)
Calcium: 9.2 mg/dL (ref 8.7–10.2)
Chloride: 104 mmol/L (ref 96–106)
Creatinine, Ser: 0.93 mg/dL (ref 0.76–1.27)
Globulin, Total: 2 g/dL (ref 1.5–4.5)
Glucose: 111 mg/dL — ABNORMAL HIGH (ref 70–99)
Potassium: 4 mmol/L (ref 3.5–5.2)
Sodium: 142 mmol/L (ref 134–144)
Total Protein: 6.9 g/dL (ref 6.0–8.5)
eGFR: 118 mL/min/{1.73_m2} (ref >59)

## 2021-05-29 LAB — CBC
Hematocrit: 44.1 % (ref 37.5–51.0)
Hemoglobin: 15.6 g/dL (ref 13.0–17.7)
MCH: 30.7 pg (ref 26.6–33.0)
MCHC: 35.4 g/dL (ref 31.5–35.7)
MCV: 87 fL (ref 79–97)
Platelets: 172 10*3/uL (ref 150–450)
RBC: 5.08 x10E6/uL (ref 4.14–5.80)
RDW: 12.8 % (ref 11.6–15.4)
WBC: 3.6 10*3/uL (ref 3.4–10.8)

## 2021-05-29 LAB — SARS-COV-2, NAA 2 DAY TAT

## 2021-05-29 LAB — NOVEL CORONAVIRUS, NAA: SARS-CoV-2, NAA: NOT DETECTED

## 2021-06-03 ENCOUNTER — Telehealth: Payer: Self-pay

## 2021-06-03 NOTE — Telephone Encounter (Signed)
Called to CM to find out about medication from ED visit. ED visit was 1/23. This RNCM noted that hte patient left before the testing was completed. NO medications were ordered at DC, but he stated the provider said he would order. Will message provider for clarification.The mediation is antivert.

## 2021-06-10 ENCOUNTER — Telehealth: Payer: 59 | Admitting: Family

## 2021-06-10 DIAGNOSIS — R42 Dizziness and giddiness: Secondary | ICD-10-CM

## 2021-06-10 MED ORDER — ONDANSETRON HCL 4 MG PO TABS: 4.0000 mg | ORAL_TABLET | Freq: Three times a day (TID) | ORAL | 0 refills | Status: DC | PRN

## 2021-06-10 MED ORDER — MECLIZINE HCL 25 MG PO TABS: 25.0000 mg | ORAL_TABLET | Freq: Three times a day (TID) | ORAL | 0 refills | Status: DC | PRN

## 2021-06-10 NOTE — Progress Notes (Signed)
E Visit for Motion Sickness  We are sorry that you are not feeling well. Here is how we plan to help!  Based on what you have shared with me it looks like you have symptoms of motion sickness.  I have prescribed a medication that will help prevent or alleviate your symptoms:  Meclizine 25mg  by mouth three times per day as needed for nausea/motion sickness. I have also sent in zofran for nausea, just in case.    Prevention:  You might feel better if you keep your eyes focused on outside while you are in motion. For example, if you are in a car, sit in the front and look in the direction you are moving; if you are on a boat, stay on the deck and look to the horizon. This helps make what you see match the movement you are feeling, and so you are less likely to feel sick.  You should also avoid reading, watching a movie, texting or reading messages, or looking at things close to you inside the vehicle you are riding in.  Use the seat head rest. Lean your head against the back of the seat or head rest when traveling in vehicles with seats to minimize head movements.  On a ship: When making your reservations, choose a cabin in the middle of the ship and near the waterline. When on board, go up on deck and focus on the horizon.  In an airplane: Request a window seat and look out the window. A seat over the front edge of the wing is the most preferable spot (the degree of motion is the lowest here). Direct the air vent to blow cool air on your face.  On a train: Always face forward and sit near a window.  In a vehicle: Sit in the front seat; if you are the passenger, look at the scenery in the distance. For some people, driving the vehicle (rather than being a passenger) is an instant remedy.  Avoid others who have become nauseous with motion sickness. Seeing and smelling others who have motion sickness may cause you to become sick.  GET HELP RIGHT AWAY IF:  Your symptoms do not improve or  worsen within 2 days after treatment.  You cannot keep down fluids after trying the medication.  Other associated symptoms such as severe headache, visual field changes, fever, or intractable nausea and vomiting.  MAKE SURE YOU:  Understand these instructions. Will watch your condition. Will get help right away if you are not doing well or get worse.  Thank you for choosing an e-visit.  Your e-visit answers were reviewed by a board certified advanced clinical practitioner to complete your personal care plan. Depending upon the condition, your plan could have included both over the counter or prescription medications.  Please review your pharmacy choice. Be sure that the pharmacy you have chosen is open so that you can pick up your prescription now.  If there is a problem you may message your provider in MyChart to have the prescription routed to another pharmacy.  Your safety is important to . If you have drug allergies check your prescription carefully.   For the next 24 hours, you can use MyChart to ask questions about today's visit, request a non-urgent call back, or ask for a work or school excuse from your e-visit provider.  You will get an e-mail in the next two days asking about your experience. I hope that your e-visit has been valuable and will speed  your recovery.   References or for more information: https://cross.com/ https://my.https://rowe.info/ https://www.uptodate.com    Approximately 5 minutes was spent documenting and reviewing patient's chart.

## 2021-06-12 DIAGNOSIS — Z8241 Family history of sudden cardiac death: Secondary | ICD-10-CM | POA: Insufficient documentation

## 2021-06-12 DIAGNOSIS — R42 Dizziness and giddiness: Secondary | ICD-10-CM | POA: Insufficient documentation

## 2021-06-14 DIAGNOSIS — E538 Deficiency of other specified B group vitamins: Secondary | ICD-10-CM | POA: Insufficient documentation

## 2021-06-14 DIAGNOSIS — E559 Vitamin D deficiency, unspecified: Secondary | ICD-10-CM | POA: Insufficient documentation

## 2021-07-06 ENCOUNTER — Ambulatory Visit: Payer: 59 | Admitting: Internal Medicine

## 2021-07-06 NOTE — Progress Notes (Deleted)
?Cardiology Office Note:   ? ?Date:  07/06/2021  ? ?ID:  Joshua Greer, DOB 12-Jun-1995, MRN [id number] ? ?PCP:  Pcp, No ?  ?[facility] Providers ?Cardiologist:  None { ?Click to update primary MD,subspecialty MD or APP then REFRESH:1}   ? ?Referring MD: Dot Been, FNP  ? ?No chief complaint on file. ?Abnormal EKG ? ?History of Present Illness:   ? ?Joshua Greer is a 26 y.o. male with a hx of atypical chest pain and dizziness, very frequent ED visits including for atypical chest pain, referral from the ED for  an abnormal EKG ? ? ?One EKG 05/29/2021 shows ?right axis deviation.  Also has artifact. He has frequent ED visits for atypical chest pain and minor symptoms. ? ? ?Past Medical History:  ?Diagnosis Date  ? Anemia   ? Asthma   ? History of blood transfusion   ? Seasonal allergies   ? ? ?Past Surgical History:  ?Procedure Laterality Date  ? HERNIA REPAIR    ? ? ?Current Medications: ?No outpatient medications have been marked as taking for the 07/06/21 encounter (Appointment) with Maisie Fus, MD.  ?  ? ?Allergies:   Patient has no known allergies.  ? ?Social History  ? ?Socioeconomic History  ? Marital status: Single  ?  Spouse name: Not on file  ? Number of children: Not on file  ? Years of education: Not on file  ? Highest education level: Not on file  ?Occupational History  ? Not on file  ?Tobacco Use  ? Smoking status: Never  ? Smokeless tobacco: Never  ?Substance and Sexual Activity  ? Alcohol use: No  ? Drug use: No  ? Sexual activity: Not on file  ?Other Topics Concern  ? Not on file  ?Social History Narrative  ? Not on file  ? ?Social Determinants of Health  ? ?Financial Resource Strain: Not on file  ?Food Insecurity: Not on file  ?Transportation Needs: Not on file  ?Physical Activity: Not on file  ?Stress: Not on file  ?Social Connections: Not on file  ?  ? ?Family History: ?The patient's ***family history includes Healthy in his father and mother. ? ?ROS:   ?Please see the history of present  illness.    ?*** All other systems reviewed and are negative. ? ?EKGs/Labs/Other Studies Reviewed:   ? ?The following studies were reviewed today: ?*** ? ?EKG:  EKG is *** ordered today.  The ekg ordered today demonstrates *** ? ?Recent Labs: ?05/28/2021: ALT 13; BUN 13; Creatinine, Ser 0.94; Hemoglobin 15.4; Platelets 169; Potassium 3.8; Sodium 138  ?Recent Lipid Panel ?No results found for: CHOL, TRIG, HDL, CHOLHDL, VLDL, LDLCALC, LDLDIRECT ? ? ?Risk Assessment/Calculations:   ?{Does this patient have ATRIAL FIBRILLATION?:[id number]} ? ?    ? ?Physical Exam:   ? ?VS:  There were no vitals taken for this visit.   ? ?Wt Readings from Last 3 Encounters:  ?02/12/21 130 lb (59 kg)  ?10/26/19 196 lb 6.4 oz (89.1 kg)  ?06/07/15 152 lb (68.9 kg) (56 %, Z= 0.14)*  ? ?* Growth percentiles are based on CDC (Boys, 2-20 Years) data.  ?  ? ?GEN: *** Well nourished, well developed in no acute distress ?HEENT: Normal ?NECK: No JVD; No carotid bruits ?LYMPHATICS: No lymphadenopathy ?CARDIAC: ***RRR, no murmurs, rubs, gallops ?RESPIRATORY:  Clear to auscultation without rales, wheezing or rhonchi  ?ABDOMEN: Soft, non-tender, non-distended ?MUSCULOSKELETAL:  No edema; No deformity  ?SKIN: Warm and dry ?NEUROLOGIC:  Alert and oriented  x 3 ?PSYCHIATRIC:  Normal affect  ? ?ASSESSMENT:   ? ?#Abnormal EKG: Suspect lead placement issue with EKG 05/29/2021. Prior EKGs show normal sinus rhythm. No axis deviation. He has hx of atypical CP. He does not have an indication to continue to follow with cardiology at this time. ? ? ?PLAN:   ? ?In order of problems listed above: ? ?*** ? ?   ? ?{Are you ordering a CV Procedure (e.g. stress test, cath, DCCV, TEE, etc)?   Press F2        :[id number]}  ? ? ?Medication Adjustments/Labs and Tests Ordered: ?Current medicines are reviewed at length with the patient today.  Concerns regarding medicines are outlined above.  ?No orders of the defined types were placed in this encounter. ? ?No orders of the  defined types were placed in this encounter. ? ? ?There are no Patient Instructions on file for this visit.  ? ?Signed, ?Maisie Fus, MD  ?07/06/2021 12:49 PM    ?[facility] ?

## 2021-07-30 ENCOUNTER — Ambulatory Visit: Payer: 59 | Admitting: Internal Medicine

## 2021-07-30 NOTE — Progress Notes (Deleted)
?Cardiology Office Note:   ? ?Date:  07/30/2021  ? ?ID:  Joshua Greer, DOB 10/11/1995, MRN [id number] ? ?PCP:  Pcp, No ?  ?[facility] Providers ?Cardiologist:  None { ?Click to update primary MD,subspecialty MD or APP then REFRESH:1}   ? ?Referring MD: Joycelyn Man, FNP  ? ?No chief complaint on file. ?*** ? ?History of Present Illness:   ? ?Joshua Greer is a 26 y.o. male with a hx of asthma, vertigo ? ? ? ?EKG- NSR, lead reversal ? ?Past Medical History:  ?Diagnosis Date  ? Anemia   ? Asthma   ? History of blood transfusion   ? Seasonal allergies   ? ? ?Past Surgical History:  ?Procedure Laterality Date  ? HERNIA REPAIR    ? ? ?Current Medications: ?No outpatient medications have been marked as taking for the 07/30/21 encounter (Appointment) with Janina Mayo, MD.  ?  ? ?Allergies:   Patient has no known allergies.  ? ?Social History  ? ?Socioeconomic History  ? Marital status: Single  ?  Spouse name: Not on file  ? Number of children: Not on file  ? Years of education: Not on file  ? Highest education level: Not on file  ?Occupational History  ? Not on file  ?Tobacco Use  ? Smoking status: Never  ? Smokeless tobacco: Never  ?Substance and Sexual Activity  ? Alcohol use: No  ? Drug use: No  ? Sexual activity: Not on file  ?Other Topics Concern  ? Not on file  ?Social History Narrative  ? Not on file  ? ?Social Determinants of Health  ? ?Financial Resource Strain: Not on file  ?Food Insecurity: Not on file  ?Transportation Needs: Not on file  ?Physical Activity: Not on file  ?Stress: Not on file  ?Social Connections: Not on file  ?  ? ?Family History: ?The patient's ***family history includes Healthy in his father and mother. ? ?ROS:   ?Please see the history of present illness.    ?*** All other systems reviewed and are negative. ? ?EKGs/Labs/Other Studies Reviewed:   ? ?The following studies were reviewed today: ?*** ? ?EKG:  EKG is *** ordered today.  The ekg ordered today demonstrates *** ? ?Recent  Labs: ?05/28/2021: ALT 13; BUN 13; Creatinine, Ser 0.94; Hemoglobin 15.4; Platelets 169; Potassium 3.8; Sodium 138  ?Recent Lipid Panel ?No results found for: CHOL, TRIG, HDL, CHOLHDL, VLDL, LDLCALC, LDLDIRECT ? ? ?Risk Assessment/Calculations:   ?{Does this patient have ATRIAL FIBRILLATION?:[id number]} ? ?    ? ?Physical Exam:   ? ?VS:  There were no vitals taken for this visit.   ? ?Wt Readings from Last 3 Encounters:  ?02/12/21 130 lb (59 kg)  ?10/26/19 196 lb 6.4 oz (89.1 kg)  ?06/07/15 152 lb (68.9 kg) (56 %, Z= 0.14)*  ? ?* Growth percentiles are based on CDC (Boys, 2-20 Years) data.  ?  ? ?GEN: *** Well nourished, well developed in no acute distress ?HEENT: Normal ?NECK: No JVD; No carotid bruits ?LYMPHATICS: No lymphadenopathy ?CARDIAC: ***RRR, no murmurs, rubs, gallops ?RESPIRATORY:  Clear to auscultation without rales, wheezing or rhonchi  ?ABDOMEN: Soft, non-tender, non-distended ?MUSCULOSKELETAL:  No edema; No deformity  ?SKIN: Warm and dry ?NEUROLOGIC:  Alert and oriented x 3 ?PSYCHIATRIC:  Normal affect  ? ?ASSESSMENT:   ? ?No diagnosis found. ?PLAN:   ? ?In order of problems listed above: ? ?*** ? ?   ? ?{Are you ordering a CV Procedure (e.g. stress test,  cath, DCCV, TEE, etc)?   Press F2        :[id number]  ? ? ?Medication Adjustments/Labs and Tests Ordered: ?Current medicines are reviewed at length with the patient today.  Concerns regarding medicines are outlined above.  ?No orders of the defined types were placed in this encounter. ? ?No orders of the defined types were placed in this encounter. ? ? ?There are no Patient Instructions on file for this visit.  ? ?Signed, ?Janina Mayo, MD  ?07/30/2021 8:03 AM    ?[employer] ?

## 2022-03-17 IMAGING — CR DG CHEST 2V
2 series · 2 of 2 positions shown · non-contrast
Comparison: 02/15/2021

CLINICAL DATA: Left chest pain and headache.

EXAM:
CHEST - 2 VIEW

[chest pa]
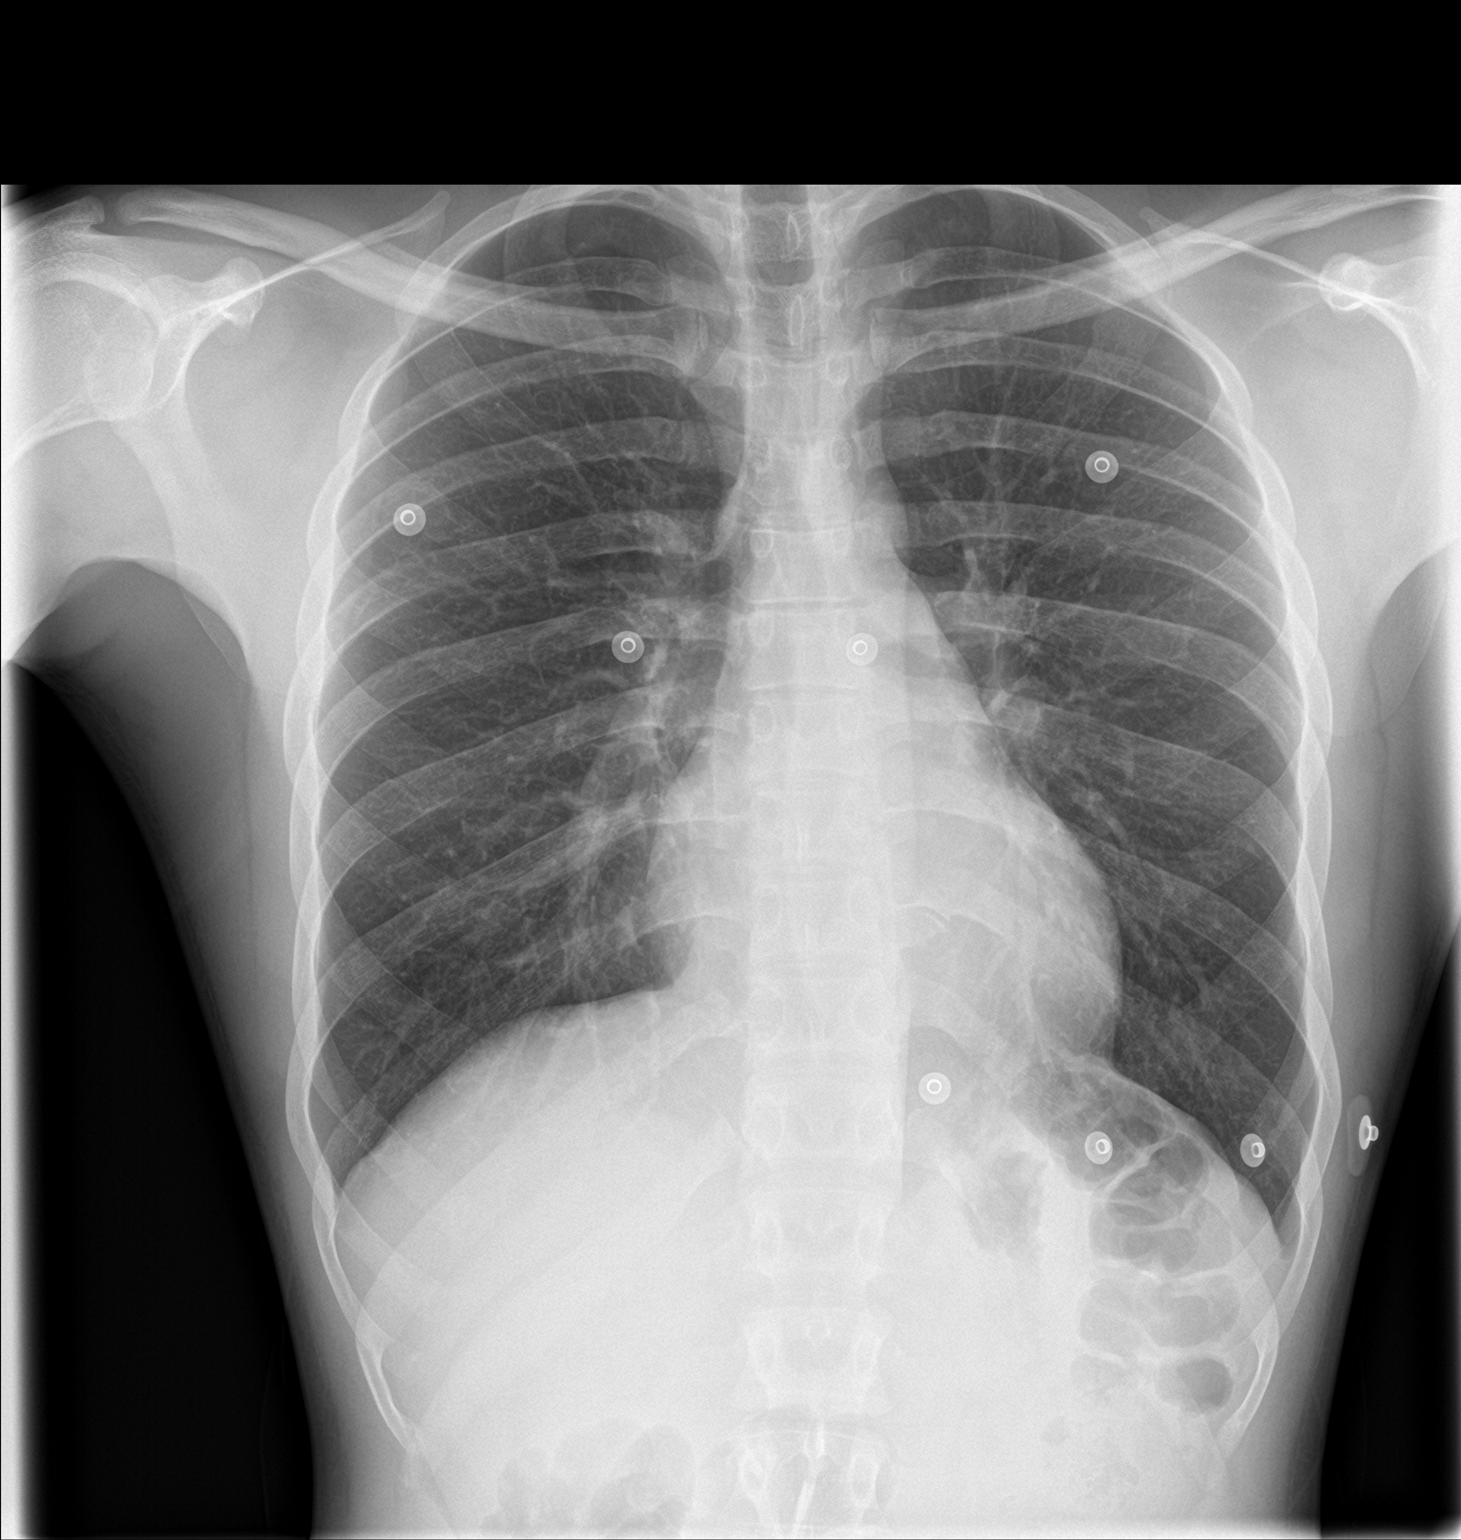

[chest lat]
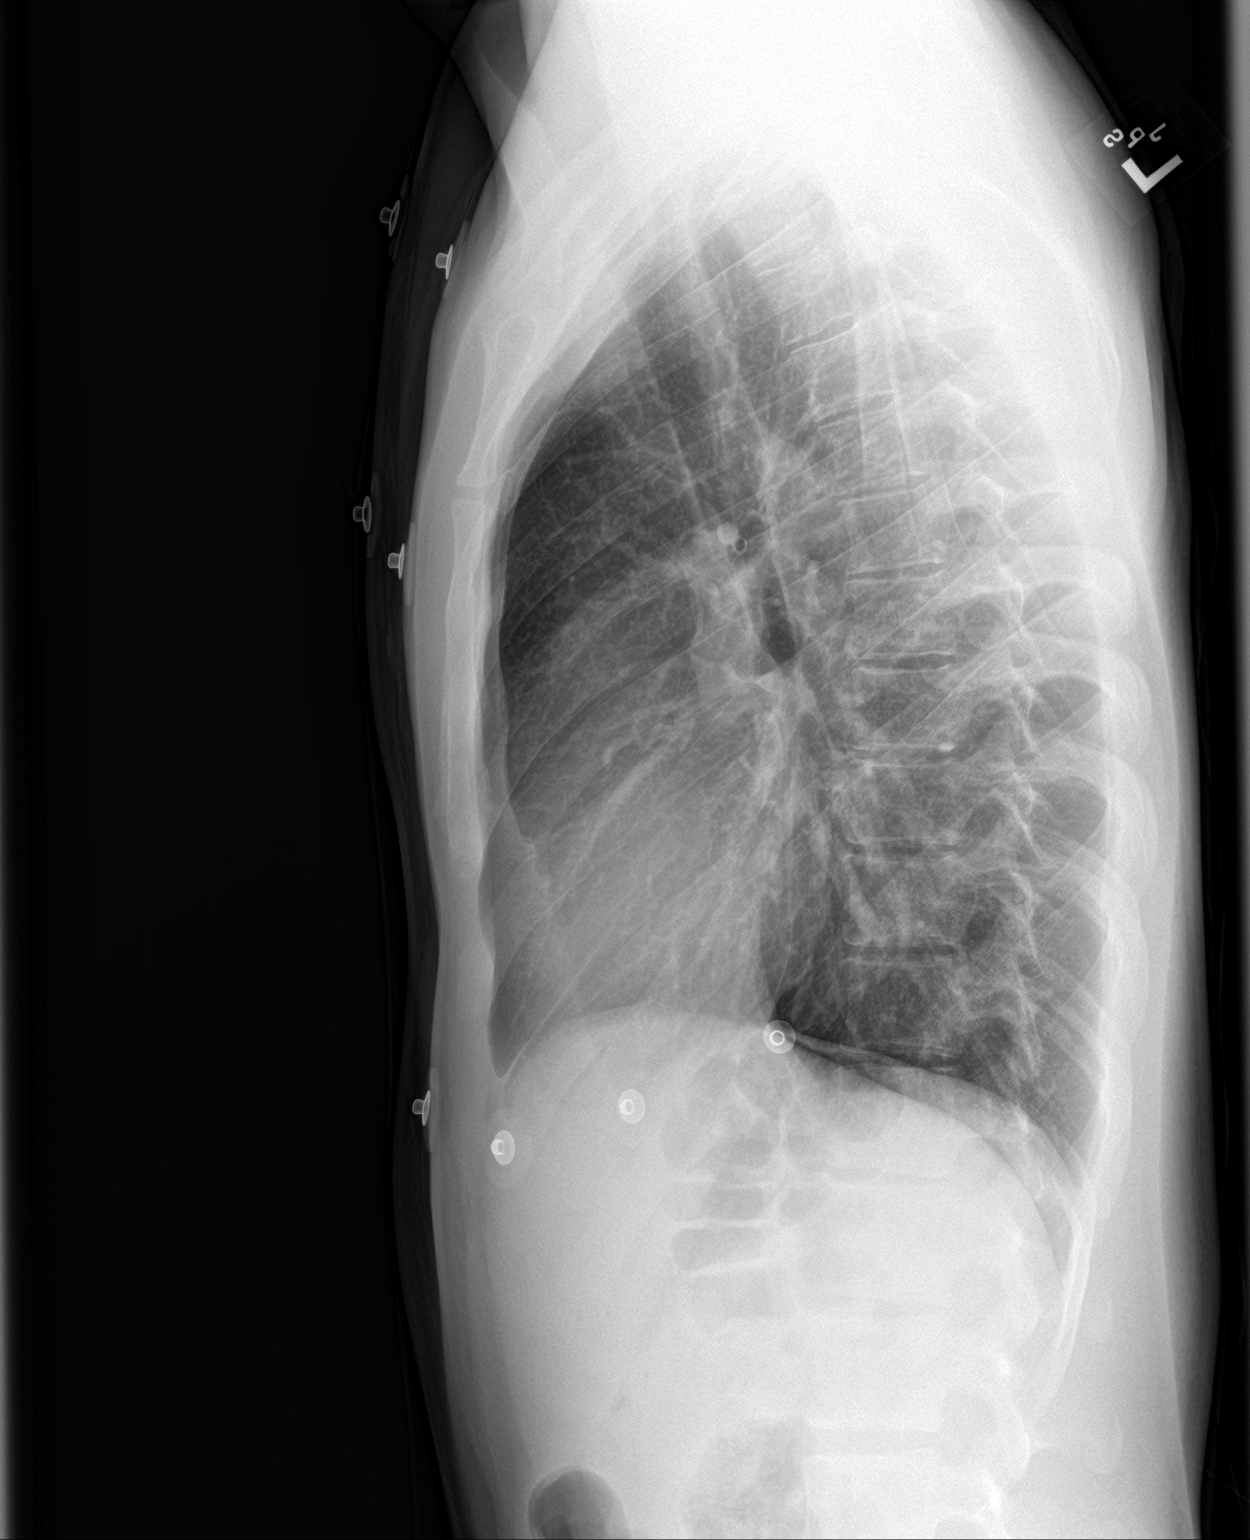

[2 of 2 positions shown; findings below may reference images not displayed]

FINDINGS: The lungs appear clear. Cardiac and mediastinal contours normal. No
pleural effusion identified. No significant skeletal abnormality.
IMPRESSION: No active cardiopulmonary disease.

## 2023-01-03 DIAGNOSIS — R768 Other specified abnormal immunological findings in serum: Secondary | ICD-10-CM | POA: Insufficient documentation

## 2023-07-04 ENCOUNTER — Ambulatory Visit
Admission: EM | Admit: 2023-07-04 | Discharge: 2023-07-04 | Disposition: A | Discharge status: Home / Self Care | Payer: 59 | Attending: Family Medicine | Admitting: Family Medicine

## 2023-07-04 DIAGNOSIS — R112 Nausea with vomiting, unspecified: Secondary | ICD-10-CM | POA: Diagnosis not present

## 2023-07-04 HISTORY — DX: Other specified abnormal immunological findings in serum: R76.8

## 2023-07-04 MED ORDER — ONDANSETRON 4 MG PO TBDP
4.0000 mg | ORAL_TABLET | Freq: Once | ORAL | Status: AC
  Administered 2023-07-04: 4 mg via ORAL

## 2023-07-04 MED ORDER — ONDANSETRON 4 MG PO TBDP: 4.0000 mg | ORAL_TABLET | Freq: Three times a day (TID) | ORAL | 0 refills | Status: AC | PRN

## 2023-07-04 NOTE — ED Provider Notes (Signed)
 [name]    CSN: [id number] Arrival date & time: 07/04/23  0845      History   Chief Complaint Chief Complaint  Patient presents with   Emesis    HPI Joshua Greer is a 28 y.o. male.    Emesis  Patient is here for n/v that woke him up this morning.  Right after vomiting he had abd pain, but none currently.  Some chills, no fevers.  Mild runny nose, congestion.  He did not eat anything out of the ordinary last night.        Past Medical History:  Diagnosis Date   Anemia    Asthma    History of blood transfusion    Seasonal allergies     Patient Active Problem List   Diagnosis Date Noted   Hepatitis C antibody test positive 01/03/2023   Vitamin B12 deficiency 07-10-2021   Vitamin D deficiency Jul 10, 2021   Family history of death due to heart problem at 18 years of age or younger 06/12/2021   Vertigo 06/12/2021   GERD (gastroesophageal reflux disease) 03/02/2021   Nasal congestion 12/27/2019   Allergic rhinitis 01/12/2018    Past Surgical History:  Procedure Laterality Date   HERNIA REPAIR         Home Medications    Prior to Admission medications   Medication Sig Start Date End Date Taking? Authorizing Provider  ergocalciferol (VITAMIN D2) 1.25 MG (50000 UT) capsule Take by mouth. 10/15/22  Yes [provider]  albuterol (VENTOLIN HFA) 108 (90 Base) MCG/ACT inhaler Inhale 1-2 puffs into the lungs every 4 (four) hours as needed for wheezing or shortness of breath (cough, shortness of breath or wheezing.). 08/16/20   Bing Neighbors, NP  azithromycin (ZITHROMAX Z-PAK) 250 MG tablet Take 2 tabs by mouth day one, then one tab daily for remaining 4 days Patient not taking: No sig reported 01/24/21   Tomi Bamberger, PA-C  brompheniramine-pseudoephedrine-DM 30-2-10 MG/5ML syrup Take 5 mLs by mouth 4 (four) times daily as needed. Patient not taking: Reported on 02/15/2021 08/16/20   Bing Neighbors, NP  cetirizine (ZYRTEC  ALLERGY) 10 MG tablet Take 1 tablet (10 mg total) by mouth daily. Patient not taking: Reported on 02/15/2021 01/29/21   Wallis Bamberg, PA-C  fluticasone [facility]) 50 MCG/ACT nasal spray Place 2 sprays into both nostrils daily. 01/29/21   Wallis Bamberg, PA-C  ipratropium (ATROVENT HFA) 17 MCG/ACT inhaler Inhale 2 puffs into the lungs every 6 (six) hours as needed for wheezing. 02/15/21   Sponseller, Eugene Gavia, PA-C  meclizine (ANTIVERT) 25 MG tablet Take 1 tablet (25 mg total) by mouth 3 (three) times daily as needed for dizziness. 06/10/21   Jannifer Rodney A, FNP  montelukast (SINGULAIR) 10 MG tablet Take 1 tablet (10 mg total) by mouth at bedtime. Patient not taking: No sig reported 08/16/20   Bing Neighbors, NP  ondansetron (ZOFRAN ODT) 4 MG disintegrating tablet Take 1 tablet (4 mg total) by mouth every 8 (eight) hours as needed for nausea or vomiting. 02/15/21   Sponseller, Lupe Carney R, PA-C  ondansetron (ZOFRAN) 4 MG tablet Take 1 tablet (4 mg total) by mouth every 8 (eight) hours as needed for nausea or vomiting. 06/10/21   Junie Spencer, FNP  oseltamivir (TAMIFLU) 75 MG capsule Take 1 capsule (75 mg total) by mouth every 12 (twelve) hours. 05/03/21   Tomi Bamberger, PA-C  ALBUTEROL IN Inhale into the lungs.  10/26/19  [provider]  Family History Family History  Problem Relation Age of Onset   Healthy Mother    Healthy Father     Social History Social History   Tobacco Use   Smoking status: Never   Smokeless tobacco: Never  Vaping Use   Vaping status: Never Used  Substance Use Topics   Alcohol use: No   Drug use: No     Allergies   Patient has no known allergies.   Review of Systems Review of Systems  Constitutional: Negative.   HENT: Negative.    Respiratory: Negative.    Cardiovascular: Negative.   Gastrointestinal:  Positive for vomiting.  Genitourinary: Negative.   Musculoskeletal: Negative.   Psychiatric/Behavioral: Negative.       Physical  Exam Triage Vital Signs ED Triage Vitals  Encounter Vitals Group     BP 07/04/23 0918 109/61     Systolic BP Percentile --      Diastolic BP Percentile --      Pulse Rate 07/04/23 0918 63     Resp 07/04/23 0918 18     Temp 07/04/23 0918 98.1 F (36.7 C)     Temp Source 07/04/23 0918 Oral     SpO2 07/04/23 0918 96 %     Weight 07/04/23 0917 120 lb (54.4 kg)     Height 07/04/23 0917 5\' 5"  (1.651 m)     Head Circumference --      Peak Flow --      Pain Score 07/04/23 0915 0     Pain Loc --      Pain Education --      Exclude from Growth Chart --    No data found.  Updated Vital Signs BP 109/61 (BP Location: Left Arm)   Pulse 63   Temp 98.1 F (36.7 C) (Oral)   Resp 18   Ht 5\' 5"  (1.651 m)   Wt 54.4 kg   SpO2 96%   BMI 19.97 kg/m   Visual Acuity Right Eye Distance:   Left Eye Distance:   Bilateral Distance:    Right Eye Near:   Left Eye Near:    Bilateral Near:     Physical Exam Constitutional:      General: He is not in acute distress.    Appearance: Normal appearance. He is normal weight. He is ill-appearing. He is not toxic-appearing.  HENT:     Mouth/Throat:     Mouth: Mucous membranes are moist.  Cardiovascular:     Rate and Rhythm: Normal rate and regular rhythm.  Pulmonary:     Effort: Pulmonary effort is normal.     Breath sounds: Normal breath sounds.  Abdominal:     Palpations: Abdomen is soft.     Tenderness: There is no abdominal tenderness. There is no guarding or rebound.  Musculoskeletal:     Cervical back: Normal range of motion and neck supple. No tenderness.  Skin:    General: Skin is warm.  Neurological:     General: No focal deficit present.     Mental Status: He is alert.  Psychiatric:        Mood and Affect: Mood normal.      UC Treatments / Results  Labs (all labs ordered are listed, but only abnormal results are displayed) Labs Reviewed - No data to display  EKG   Radiology No results  found.  Procedures Procedures (including critical care time)  Medications Ordered in UC Medications  ondansetron (ZOFRAN-ODT) disintegrating tablet 4 mg (4 mg Oral  Given 07/04/23 0904)    Initial Impression / Assessment and Plan / UC Course  I have reviewed the triage vital signs and the nursing notes.  Pertinent labs & imaging results that were available during my care of the patient were reviewed by me and considered in my medical decision making (see chart for details).    Final Clinical Impressions(s) / UC Diagnoses   Final diagnoses:  Nausea and vomiting, unspecified vomiting type     Discharge Instructions      You were seen today for nausea and vomiting.  We have given you zofran while here today, and I have sent a prescription to your pharmacy as well to help with nausea/vomiting.  Please drink small amount of clear liquids at a time to ensure it stays down to avoid dehydration.  Please return if not improving with medications.     ED Prescriptions     Medication Sig Dispense Auth. Provider   ondansetron (ZOFRAN-ODT) 4 MG disintegrating tablet Take 1 tablet (4 mg total) by mouth every 8 (eight) hours as needed for nausea or vomiting. 20 tablet Jannifer Franklin, MD      PDMP not reviewed this encounter.   Jannifer Franklin, MD 07/04/23 [phone]

## 2023-07-04 NOTE — Discharge Instructions (Signed)
 You were seen today for nausea and vomiting.  We have given you zofran while here today, and I have sent a prescription to your pharmacy as well to help with nausea/vomiting.  Please drink small amount of clear liquids at a time to ensure it stays down to avoid dehydration.  Please return if not improving with medications.

## 2023-07-04 NOTE — ED Triage Notes (Signed)
"  This started this morning upon waking up with nausea and lots of vomiting around 530am, non stop since". No fever. Voids "normal". Stools "normal". No other symptoms.

## 2023-09-04 ENCOUNTER — Encounter: Payer: Self-pay | Admitting: Emergency Medicine

## 2023-09-04 ENCOUNTER — Ambulatory Visit
Admission: EM | Admit: 2023-09-04 | Discharge: 2023-09-04 | Disposition: A | Discharge status: Home / Self Care | Attending: Physician Assistant | Admitting: Physician Assistant

## 2023-09-04 DIAGNOSIS — J309 Allergic rhinitis, unspecified: Secondary | ICD-10-CM

## 2023-09-04 LAB — POC SARS CORONAVIRUS 2 AG -  ED: SARS Coronavirus 2 Ag: NEGATIVE

## 2023-09-04 MED ORDER — FEXOFENADINE HCL 60 MG PO TABS: 60.0000 mg | ORAL_TABLET | Freq: Two times a day (BID) | ORAL | 0 refills | Status: AC

## 2023-09-04 NOTE — ED Provider Notes (Signed)
[name]    CSN: [id number] Arrival date & time: 09/04/23  1137      History   Chief Complaint Chief Complaint  Patient presents with   Nasal Congestion   seasonal allergies    HPI Joshua Greer is a 28 y.o. male.   Patient here today for evaluation of sinus congestion and seasonal allergy symptoms that he has had the last 2 to 3 days.  He reports that he has not taken any medication recently.  He denies fever.  He has tried Flonase , Zyrtec  and Claritin without resolution in the past.  The history is provided by the patient.    Past Medical History:  Diagnosis Date   Anemia    Asthma    Hepatitis C antibody positive in blood    History of blood transfusion    Seasonal allergies     Patient Active Problem List   Diagnosis Date Noted   Hepatitis C antibody test positive 01/03/2023   Vitamin B12 deficiency 2021/06/16   Vitamin D deficiency 06/16/21   Family history of death due to heart problem at 84 years of age or younger 06/12/2021   Vertigo 06/12/2021   GERD (gastroesophageal reflux disease) 03/02/2021   Nasal congestion 12/27/2019   Allergic rhinitis 01/12/2018    Past Surgical History:  Procedure Laterality Date   HERNIA REPAIR         Home Medications    Prior to Admission medications   Medication Sig Start Date End Date Taking? Authorizing Provider  fexofenadine  (ALLEGRA ) 60 MG tablet Take 1 tablet (60 mg total) by mouth 2 (two) times daily. 09/04/23 10/04/23 Yes Vernestine Gondola, PA-C  ondansetron  (ZOFRAN -ODT) 4 MG disintegrating tablet Take 1 tablet (4 mg total) by mouth every 8 (eight) hours as needed for nausea or vomiting. 07/04/23  Yes Piontek, Cleveland Dales, MD  albuterol  (VENTOLIN  HFA) 108 (90 Base) MCG/ACT inhaler Inhale 1-2 puffs into the lungs every 4 (four) hours as needed for wheezing or shortness of breath (cough, shortness of breath or wheezing.). 08/16/20   Buena Carmine, NP  ergocalciferol (VITAMIN D2) 1.25 MG (50000 UT)  capsule Take by mouth. Patient not taking: Reported on 09/04/2023 10/15/22   [provider]  fluticasone  (FLONASE ) 50 MCG/ACT nasal spray Place 2 sprays into both nostrils daily. 01/29/21   Adolph Hoop, PA-C  ipratropium (ATROVENT  HFA) 17 MCG/ACT inhaler Inhale 2 puffs into the lungs every 6 (six) hours as needed for wheezing. Patient not taking: Reported on 09/04/2023 02/15/21   Sponseller, Adelle Agent, PA-C  ALBUTEROL  IN Inhale into the lungs.  10/26/19  [provider]    Family History Family History  Problem Relation Age of Onset   Healthy Mother    Healthy Father     Social History Social History   Tobacco Use   Smoking status: Never   Smokeless tobacco: Never  Vaping Use   Vaping status: Never Used  Substance Use Topics   Alcohol use: No   Drug use: No     Allergies   Patient has no known allergies.   Review of Systems Review of Systems  Constitutional:  Negative for chills and fever.  HENT:  Positive for congestion and sore throat. Negative for ear pain.   Eyes:  Negative for discharge and redness.  Respiratory:  Positive for cough. Negative for shortness of breath.   Gastrointestinal:  Negative for abdominal pain, nausea and vomiting.     Physical Exam Triage Vital Signs ED Triage Vitals [  09/04/23 1208]  Encounter Vitals Group     BP 99/66     Systolic BP Percentile      Diastolic BP Percentile      Pulse Rate 76     Resp 16     Temp 98.6 F (37 C)     Temp Source Oral     SpO2 97 %     Weight      Height      Head Circumference      Peak Flow      Pain Score 3     Pain Loc      Pain Education      Exclude from Growth Chart    No data found.  Updated Vital Signs BP 99/66 (BP Location: Left Arm)   Pulse 76   Temp 98.6 F (37 C) (Oral)   Resp 16   SpO2 97%   Visual Acuity Right Eye Distance:   Left Eye Distance:   Bilateral Distance:    Right Eye Near:   Left Eye Near:    Bilateral Near:     Physical Exam Vitals and  nursing note reviewed.  Constitutional:      General: He is not in acute distress.    Appearance: Normal appearance. He is not ill-appearing.  HENT:     Head: Normocephalic and atraumatic.     Nose: Congestion present.     Mouth/Throat:     Mouth: Mucous membranes are moist.     Pharynx: Oropharynx is clear. No oropharyngeal exudate or posterior oropharyngeal erythema.  Eyes:     Conjunctiva/sclera: Conjunctivae normal.  Cardiovascular:     Rate and Rhythm: Normal rate and regular rhythm.     Heart sounds: Normal heart sounds. No murmur heard. Pulmonary:     Effort: Pulmonary effort is normal. No respiratory distress.     Breath sounds: Normal breath sounds. No wheezing, rhonchi or rales.  Skin:    General: Skin is warm and dry.  Neurological:     Mental Status: He is alert.  Psychiatric:        Mood and Affect: Mood normal.        Thought Content: Thought content normal.      UC Treatments / Results  Labs (all labs ordered are listed, but only abnormal results are displayed) Labs Reviewed  POC SARS CORONAVIRUS 2 AG -  ED    EKG   Radiology No results found.  Procedures Procedures (including critical care time)  Medications Ordered in UC Medications - No data to display  Initial Impression / Assessment and Plan / UC Course  I have reviewed the triage vital signs and the nursing notes.  Pertinent labs & imaging results that were available during my care of the patient were reviewed by me and considered in my medical decision making (see chart for details).    Will trial Allegra  for treatment of allergic rhinitis.  COVID screening negative.  Encouraged follow-up if no gradual improvement or with any worsening symptoms.  Final Clinical Impressions(s) / UC Diagnoses   Final diagnoses:  Allergic rhinitis, unspecified seasonality, unspecified trigger   Discharge Instructions   None    ED Prescriptions     Medication Sig Dispense Auth. Provider    fexofenadine  (ALLEGRA ) 60 MG tablet Take 1 tablet (60 mg total) by mouth 2 (two) times daily. 60 tablet Vernestine Gondola, PA-C      PDMP not reviewed this encounter.   Vernestine Gondola, PA-C  09/04/23 1439  

## 2023-09-04 NOTE — ED Triage Notes (Signed)
 Pt reports sinus congestion and severe seasonal allergies x2-3 days. Pt states his allergies are so much worse this year. Not currently taking any meds but usually takes flonase .

## 2024-05-04 ENCOUNTER — Telehealth: Admitting: Emergency Medicine

## 2024-05-04 DIAGNOSIS — J019 Acute sinusitis, unspecified: Secondary | ICD-10-CM

## 2024-05-04 DIAGNOSIS — B9789 Other viral agents as the cause of diseases classified elsewhere: Secondary | ICD-10-CM | POA: Diagnosis not present

## 2024-05-04 MED ORDER — AZELASTINE HCL 0.1 % NA SOLN: 2.0000 | Freq: Two times a day (BID) | NASAL | 0 refills | Status: AC

## 2024-05-04 NOTE — Patient Instructions (Signed)
" °  Adrien Shove, thank you for joining Jon CHRISTELLA Belt, NP for today's virtual visit.  While this provider is not your primary care provider (PCP), if your PCP is located in our provider database this encounter information will be shared with them immediately following your visit.   A [facility] MyChart account gives you access to today's visit and all your visits, tests, and labs performed at [facility]  click here if you don't have a [facility] MyChart account or go to [email or web address]  Consent: (Patient) Hashim Eichhorst provided verbal consent for this virtual visit at the beginning of the encounter.  Current Medications:  Current Outpatient Medications:    azelastine (ASTELIN) 0.1 % nasal spray, Place 2 sprays into both nostrils 2 (two) times daily. Use in each nostril as directed, Disp: 30 mL, Rfl: 0   albuterol  (VENTOLIN  HFA) 108 (90 Base) MCG/ACT inhaler, Inhale 1-2 puffs into the lungs every 4 (four) hours as needed for wheezing or shortness of breath (cough, shortness of breath or wheezing.)., Disp: 8 g, Rfl: 0   ergocalciferol (VITAMIN D2) 1.25 MG (50000 UT) capsule, Take by mouth. (Patient not taking: Reported on 09/04/2023), Disp: , Rfl:    fexofenadine  (ALLEGRA ) 60 MG tablet, Take 1 tablet (60 mg total) by mouth 2 (two) times daily., Disp: 60 tablet, Rfl: 0   fluticasone  (FLONASE ) 50 MCG/ACT nasal spray, Place 2 sprays into both nostrils daily., Disp: 16 g, Rfl: 12   ipratropium (ATROVENT  HFA) 17 MCG/ACT inhaler, Inhale 2 puffs into the lungs every 6 (six) hours as needed for wheezing. (Patient not taking: Reported on 09/04/2023), Disp: 1 each, Rfl: 12   ondansetron  (ZOFRAN -ODT) 4 MG disintegrating tablet, Take 1 tablet (4 mg total) by mouth every 8 (eight) hours as needed for nausea or vomiting., Disp: 20 tablet, Rfl: 0   Medications ordered in this encounter:  Meds ordered this encounter  Medications   azelastine (ASTELIN) 0.1 % nasal spray    Sig: Place 2  sprays into both nostrils 2 (two) times daily. Use in each nostril as directed    Dispense:  30 mL    Refill:  0     *If you need refills on other medications prior to your next appointment, please contact your pharmacy*  Follow-Up: Call back or seek an in-person evaluation if the symptoms worsen or if the condition fails to improve as anticipated.  [facility] [phone]  Other Instructions  Also purchase and take Mucinex (ok to use generic brand guaifenesin) and use saline nasal spray.    If you have been instructed to have an in-person evaluation today at a local Urgent Care facility, please use the link below. It will take you to a list of all of our available [facility], including address, phone number and hours of operation. Please do not delay care.  [facility]  If you or a family member do not have a primary care provider, use the link below to schedule a visit and establish care. When you choose a [facility] primary care physician or advanced practice provider, you gain a long-term partner in health. Find a Primary Care Provider  Learn more about [facility]'s in-office and virtual care options: [facility] - Get Care Now  "

## 2024-05-04 NOTE — Progress Notes (Signed)
 " Virtual Visit Consent   Joshua Greer, you are scheduled for a virtual visit with a  provider today. Just as with appointments in the office, your consent must be obtained to participate. Your consent will be active for this visit and any virtual visit you may have with one of our providers in the next 365 days. If you have a MyChart account, a copy of this consent can be sent to you electronically.  As this is a virtual visit, video technology does not allow for your provider to perform a traditional examination. This may limit your provider's ability to fully assess your condition. If your provider identifies any concerns that need to be evaluated in person or the need to arrange testing (such as labs, EKG, etc.), we will make arrangements to do so. Although advances in technology are sophisticated, we cannot ensure that it will always work on either your end or our end. If the connection with a video visit is poor, the visit may have to be switched to a telephone visit. With either a video or telephone visit, we are not always able to ensure that we have a secure connection.  By engaging in this virtual visit, you consent to the provision of healthcare and authorize for your insurance to be billed (if applicable) for the services provided during this visit. Depending on your insurance coverage, you may receive a charge related to this service.  I need to obtain your verbal consent now. Are you willing to proceed with your visit today? Joshua Greer has provided verbal consent on 05/04/2024 for a virtual visit (video or telephone). Jon CHRISTELLA Belt, NP  Date: 05/04/2024 12:23 PM   Virtual Visit via Video Note   I, Jon CHRISTELLA Belt, connected with  Joshua Greer  ([id number], 1996-05-02) on 05/04/2024 at 12:15 PM EST by a video-enabled telemedicine application and verified that I am speaking with the correct person using two identifiers.  Location: Patient: Virtual Visit Location Patient:  Home Provider: Virtual Visit Location Provider: Home Office   I discussed the limitations of evaluation and management by telemedicine and the availability of in person appointments. The patient expressed understanding and agreed to proceed.    History of Present Illness: Joshua Greer is a 28 y.o. who identifies as a male who was assigned male at birth, and is being seen today for Sick since 05/02/24: congestion, headache, post nasal drainage, lots of max sinus pressure, sneezing, scratchy throat. No fever. Daughter had flu B last week. He doesn't think he has flu, sx different than daughter's sx.   No coughing or body aches. No nasal discharge, is all draining down his throat.   Taking alka seltzer cold medicine for sx.   HPI: HPI  Problems:  Patient Active Problem List   Diagnosis Date Noted   Hepatitis C antibody test positive 01/03/2023   Vitamin B12 deficiency 07-10-2021   Vitamin D deficiency 10-Jul-2021   Family history of death due to heart problem at 12 years of age or younger 06/12/2021   Vertigo 06/12/2021   GERD (gastroesophageal reflux disease) 03/02/2021   Nasal congestion 12/27/2019   Allergic rhinitis 01/12/2018    Allergies: Allergies[1] Medications: Current Medications[2]  Observations/Objective: Patient is well-developed, well-nourished in no acute distress.  Resting comfortably  at home.  Head is normocephalic, atraumatic.  No labored breathing.  Speech is clear and coherent with logical content.  Patient is alert and oriented at baseline.    Assessment and Plan: 1. Acute viral sinusitis (  Primary)  I think he has viral ifnection. I do not think he has flu. Discussed supportive care measures.   Follow Up Instructions: I discussed the assessment and treatment plan with the patient. The patient was provided an opportunity to ask questions and all were answered. The patient agreed with the plan and demonstrated an understanding of the instructions.  A copy  of instructions were sent to the patient via MyChart unless otherwise noted below.   The patient was advised to call back or seek an in-person evaluation if the symptoms worsen or if the condition fails to improve as anticipated.    Jon CHRISTELLA Belt, NP     [1] No Known Allergies [2]  Current Outpatient Medications:    azelastine (ASTELIN) 0.1 % nasal spray, Place 2 sprays into both nostrils 2 (two) times daily. Use in each nostril as directed, Disp: 30 mL, Rfl: 0   albuterol  (VENTOLIN  HFA) 108 (90 Base) MCG/ACT inhaler, Inhale 1-2 puffs into the lungs every 4 (four) hours as needed for wheezing or shortness of breath (cough, shortness of breath or wheezing.)., Disp: 8 g, Rfl: 0   ergocalciferol (VITAMIN D2) 1.25 MG (50000 UT) capsule, Take by mouth. (Patient not taking: Reported on 09/04/2023), Disp: , Rfl:    fexofenadine  (ALLEGRA ) 60 MG tablet, Take 1 tablet (60 mg total) by mouth 2 (two) times daily., Disp: 60 tablet, Rfl: 0   fluticasone  (FLONASE ) 50 MCG/ACT nasal spray, Place 2 sprays into both nostrils daily., Disp: 16 g, Rfl: 12   ipratropium (ATROVENT  HFA) 17 MCG/ACT inhaler, Inhale 2 puffs into the lungs every 6 (six) hours as needed for wheezing. (Patient not taking: Reported on 09/04/2023), Disp: 1 each, Rfl: 12   ondansetron  (ZOFRAN -ODT) 4 MG disintegrating tablet, Take 1 tablet (4 mg total) by mouth every 8 (eight) hours as needed for nausea or vomiting., Disp: 20 tablet, Rfl: 0  "

## 2024-05-28 ENCOUNTER — Other Ambulatory Visit: Payer: Self-pay | Admitting: Emergency Medicine
